# Patient Record
Sex: Female | Born: 1950
Health system: Southern US, Community
[De-identification: ages and names within clinical notes are randomized; demographics above are authoritative.]

## PROBLEM LIST (undated history)

## (undated) DIAGNOSIS — R112 Nausea with vomiting, unspecified: Secondary | ICD-10-CM

## (undated) DIAGNOSIS — R002 Palpitations: Secondary | ICD-10-CM

## (undated) DIAGNOSIS — K449 Diaphragmatic hernia without obstruction or gangrene: Secondary | ICD-10-CM

## (undated) DIAGNOSIS — I10 Essential (primary) hypertension: Secondary | ICD-10-CM

## (undated) DIAGNOSIS — I351 Nonrheumatic aortic (valve) insufficiency: Secondary | ICD-10-CM

## (undated) DIAGNOSIS — E785 Hyperlipidemia, unspecified: Secondary | ICD-10-CM

## (undated) DIAGNOSIS — Z9889 Other specified postprocedural states: Secondary | ICD-10-CM

## (undated) DIAGNOSIS — Z8489 Family history of other specified conditions: Secondary | ICD-10-CM

## (undated) HISTORY — PX: ABDOMINAL HYSTERECTOMY: SHX81

## (undated) HISTORY — PX: OTHER SURGICAL HISTORY: SHX169

## (undated) HISTORY — PX: KNEE SURGERY: SHX244

## (undated) HISTORY — PX: TONSILLECTOMY: SUR1361

## (undated) HISTORY — DX: Essential (primary) hypertension: I10

## (undated) HISTORY — DX: Diaphragmatic hernia without obstruction or gangrene: K44.9

## (undated) HISTORY — PX: CHOLECYSTECTOMY: SHX55

---

## 2007-05-17 ENCOUNTER — Ambulatory Visit (HOSPITAL_COMMUNITY): Admission: RE | Admit: 2007-05-17 | Discharge: 2007-05-17 | Payer: Self-pay | Admitting: Orthopaedic Surgery

## 2007-10-27 ENCOUNTER — Ambulatory Visit (HOSPITAL_COMMUNITY): Admission: RE | Admit: 2007-10-27 | Discharge: 2007-10-27 | Payer: Self-pay | Admitting: Urology

## 2011-05-12 NOTE — Op Note (Signed)
NAME:  Judy Mitchell, Judy Mitchell                ACCOUNT NO.:  [id number]   MEDICAL RECORD NO.:  [id number]          PATIENT TYPE:  AMB   LOCATION:  DAY                           FACILITY:  [facility]   PHYSICIAN:  J. Darreld Mclean, M.D. DATE OF BIRTH:  1951/05/10   DATE OF PROCEDURE:  05/17/2007  DATE OF DISCHARGE:                               OPERATIVE REPORT   PREOPERATIVE DIAGNOSIS:  Tear in medial and lateral meniscus of the  right knee.   POSTOPERATIVE DIAGNOSES:  1. Tear in medial meniscus of the right knee.  2. Medial femoral condyle defect, small loose fragments and particles.   PROCEDURE:  1. Arthroscopy of the right knee.  2. Partial medial meniscectomy.  3. Removal of loose particles.   ANESTHESIA:  General.   SURGEON:  Keeling.   SPECIMENS:  Portions of the medial meniscus and the loose body sent to  pathology.   TOURNIQUET TIME:  26 minutes.   No drains used.   INDICATIONS:  The patient is a 60 year old female with giving way and  pain in her knee on the left.  An MRI shows a tear of the medial  meniscus flap on the medial femoral condyle loose fragment and a tear in  the lateral side.  Surgery was recommended as she did not improve with  conservative treatment.  Risk and imponderables of the procedure were  discussed preoperatively, she appeared to understand and agreed to the  procedure as outlined.   DESCRIPTION OF PROCEDURE:  The patient was seen in the holding area.  The right knee was identified as the correct surgical site.  She placed  a marker on the right knee, I placed a marker on the right knee.  I  talked with family.  The patient was brought to the operating room and  given general anesthesia while supine on the operating room table.  Tourniquet placed deflated on right upper thigh.  A leg holder placed  right upper thigh.  She was prepped and draped in the usual manner.  We  had a time-out identifying Ms. Balbuena as the patient, right knee as  correct  surgical site.  The leg was prepared, the leg was elevated and  wrapped circumferentially with an Esmarch bandage.  Tourniquet inflated  to 250 mmHg, Esmarch bandage was removed.  Inflow cannula inserted  medially, lactated Ringer's instilled into the knee by an infusion pump.  Arthroscope inserted laterally and knee systematically evaluated.  Findings were the suprapatellar pouch had mild synovitis, surface of  patella had grade 2 changes particularly medially.  On the medial side  of the knee she had a tear in the posterior horn of medial meniscus,  stellate type tear, with a large articular defect of the medial femoral  condyle with some loose fragments floating.  Anterior cruciate was  intact laterally, the articular surface looked very good, grade 2  changes here, and small fragments within the knee.  There was no tear of  the meniscus, some slight fraying of the meniscus, could visualize it  very well.   Attention directed back to the medial  side of the knee and the loose  fragments were removed with the sucker shaver apparatus.  She has a  large articular defect of the medial femoral condyle and permanent  pictures were taken of this and throughout the entire case.  Meniscus  was removed using meniscal shaver, meniscal punch.  Then the holmium  laser was used and a good smooth contour was obtained.  Attention was  directed back to the lateral side to make sure there was no tears.  The  MRI says there was a small tear but I could not appreciate a tear  laterally.  The knee was again systematically examined and no loose  fragments identified.  The wound irrigated with remaining part of  lactated Ringer's and wound was reapproximated using #3-0 nylon in  interrupted vertical mattress manner.  Marcaine, 0.25%, instilled in  each portal.  Tourniquet deflated after 26 minutes.  A sterile dressing  applied, a bulky dressing applied, knee immobilizer applied.  She  already has Vicodin for  pain at home.  She will have physical therapy  arranged and I will see her in the office in approximately 10 days to 2  weeks; any difficulty, she is to contact me through the office hospital  beeper system, numbers provided.           ______________________________  Shela Commons. Darreld Mclean, M.D.     JWK/MEDQ  D:  05/17/2007  T:  05/17/2007  Job:  [id number]

## 2011-05-12 NOTE — H&P (Signed)
NAME:  Judy Mitchell, Judy Mitchell                ACCOUNT NO.:  [id number]   MEDICAL RECORD NO.:  [id number]          PATIENT TYPE:  AMB   LOCATION:  DAY                           FACILITY:  [facility]   PHYSICIAN:  J. Darreld Mclean, M.D. DATE OF BIRTH:  [birth year]   DATE OF ADMISSION:  05/17/2007  DATE OF DISCHARGE:  LH                              HISTORY & PHYSICAL   CHIEF COMPLAINT:  My knee hurts.   HISTORY OF PRESENT ILLNESS:  The patient is a 60 year old female who I  first saw in the office on May 05, 2007 with a history of pain and  tenderness in her knee for several weeks, with giving away, swelling,  and tenderness. She denies any trauma. She had no fever or chills. She  had no other joint pains. I was concerned about that time about a medial  meniscal tear. I recommended a MRI. She is extremely claustrophobic. She  went to get the MRI at [facility] but she was unable to get it at the  open unit. They suggested medication and I gave her some Valium and she  went back and had the MRI done at [facility] on May 12, 2007. MRI  shows horizontal under-surface tear and free edge radial tear of the  posterior horn of the medial meniscus with superimposed upon intra-  meniscal cyst. She had a full thickness cartilage defect of the medial  femoral condyle with a free cartilage fragment within the lateral knee  joint. She had a small under-surface tear of the posterior horn of the  lateral meniscus as well with ill-defined changes in the posterior horn  of the lateral meniscus. She had mild chondromalacia. She had some  changes in the MCL, consistent with recent strain and a knee effusion. I  went over the findings with her in the office on May 16, 2007. The  patient said that her knee was getting worse and she would like to have  surgery as soon as possible. I offered her to have surgery tomorrow,  which is the next day and she would like to go ahead and do this.   PAST MEDICAL HISTORY:   Negative.   ALLERGIES:  SULFA.   CURRENT MEDICATIONS:  Naprosyn 500 mg p.o. b.i.d. p.c. and Vicodin ES.   HABITS:  She does not smoke. She does not use alcoholic beverages.   PRIMARY CARE PHYSICIAN:  Ernestina Penna, M.D.   PAST SURGICAL HISTORY:  She had a hysterectomy in 1992, cholecystectomy  in 1994.   FAMILY HISTORY:  Parkinsonism runs in the family.   SIGNIFICANT PHYSICAL FINDINGS:  The patient is married and lives in  [place].   PHYSICAL EXAMINATION:  VITAL SIGNS:  Blood pressure 150/88, pulse 92,  respiratory rate 16, afebrile. Height 5 foot 5, weight 134.  GENERAL:  Alert, cooperative, and oriented.  HEENT:  Negative.  NECK:  Supple.  LUNGS:  Clear to auscultation and percussion.  HEART:  Regular without murmur heard.  ABDOMEN:  Soft, nontender, without masses.  EXTREMITIES:  Painful and tender on the right knee with mild effusion.  Positive Murray medially. Negative drawer sign. Other extremities within  normal limits.  NEUROLOGIC:  Intact.  SKIN:  Intact.   IMPRESSION:  Tear of medial meniscus left knee, possible tear of lateral  meniscus __________  knee, reticular defect with loose body within the  knee.   PLAN:  Arthroscopy of the right knee. I have discussed with the patient  the plan, procedure, risks, and imponderables. Her labs are pending.                                            ______________________________  J. Darreld Mclean, M.D.     JWK/MEDQ  D:  05/16/2007  T:  05/16/2007  Job:  [id number]

## 2011-12-30 ENCOUNTER — Other Ambulatory Visit: Payer: Self-pay | Admitting: Dermatology

## 2012-12-12 ENCOUNTER — Other Ambulatory Visit: Payer: Self-pay | Admitting: Dermatology

## 2013-01-17 ENCOUNTER — Other Ambulatory Visit: Payer: Self-pay | Admitting: Dermatology

## 2013-10-11 ENCOUNTER — Other Ambulatory Visit: Payer: Self-pay | Admitting: Dermatology

## 2014-11-07 ENCOUNTER — Other Ambulatory Visit: Payer: Self-pay | Admitting: Dermatology

## 2015-04-30 ENCOUNTER — Other Ambulatory Visit: Payer: Self-pay | Admitting: Dermatology

## 2015-04-30 DIAGNOSIS — C44319 Basal cell carcinoma of skin of other parts of face: Secondary | ICD-10-CM | POA: Diagnosis not present

## 2015-04-30 DIAGNOSIS — C4401 Basal cell carcinoma of skin of lip: Secondary | ICD-10-CM | POA: Diagnosis not present

## 2015-04-30 DIAGNOSIS — D485 Neoplasm of uncertain behavior of skin: Secondary | ICD-10-CM | POA: Diagnosis not present

## 2015-04-30 DIAGNOSIS — L821 Other seborrheic keratosis: Secondary | ICD-10-CM | POA: Diagnosis not present

## 2015-04-30 DIAGNOSIS — L570 Actinic keratosis: Secondary | ICD-10-CM | POA: Diagnosis not present

## 2015-04-30 DIAGNOSIS — Z85828 Personal history of other malignant neoplasm of skin: Secondary | ICD-10-CM | POA: Diagnosis not present

## 2015-05-14 DIAGNOSIS — E785 Hyperlipidemia, unspecified: Secondary | ICD-10-CM | POA: Diagnosis not present

## 2015-05-14 DIAGNOSIS — K219 Gastro-esophageal reflux disease without esophagitis: Secondary | ICD-10-CM | POA: Diagnosis not present

## 2015-05-14 DIAGNOSIS — I10 Essential (primary) hypertension: Secondary | ICD-10-CM | POA: Diagnosis not present

## 2015-05-14 DIAGNOSIS — Z780 Asymptomatic menopausal state: Secondary | ICD-10-CM | POA: Diagnosis not present

## 2015-05-16 DIAGNOSIS — Z85828 Personal history of other malignant neoplasm of skin: Secondary | ICD-10-CM | POA: Diagnosis not present

## 2015-05-16 DIAGNOSIS — C4401 Basal cell carcinoma of skin of lip: Secondary | ICD-10-CM | POA: Diagnosis not present

## 2015-08-22 DIAGNOSIS — R109 Unspecified abdominal pain: Secondary | ICD-10-CM | POA: Diagnosis not present

## 2015-09-17 DIAGNOSIS — Z79899 Other long term (current) drug therapy: Secondary | ICD-10-CM | POA: Diagnosis not present

## 2015-09-17 DIAGNOSIS — I10 Essential (primary) hypertension: Secondary | ICD-10-CM | POA: Diagnosis not present

## 2015-09-17 DIAGNOSIS — K5900 Constipation, unspecified: Secondary | ICD-10-CM | POA: Diagnosis not present

## 2015-09-17 DIAGNOSIS — Z8249 Family history of ischemic heart disease and other diseases of the circulatory system: Secondary | ICD-10-CM | POA: Diagnosis not present

## 2015-09-17 DIAGNOSIS — K5730 Diverticulosis of large intestine without perforation or abscess without bleeding: Secondary | ICD-10-CM | POA: Diagnosis not present

## 2015-09-17 DIAGNOSIS — Z808 Family history of malignant neoplasm of other organs or systems: Secondary | ICD-10-CM | POA: Diagnosis not present

## 2015-09-17 DIAGNOSIS — K589 Irritable bowel syndrome without diarrhea: Secondary | ICD-10-CM | POA: Diagnosis not present

## 2015-09-17 DIAGNOSIS — Z882 Allergy status to sulfonamides status: Secondary | ICD-10-CM | POA: Diagnosis not present

## 2015-09-17 DIAGNOSIS — F419 Anxiety disorder, unspecified: Secondary | ICD-10-CM | POA: Diagnosis not present

## 2015-09-17 DIAGNOSIS — R109 Unspecified abdominal pain: Secondary | ICD-10-CM | POA: Diagnosis not present

## 2015-09-17 DIAGNOSIS — K219 Gastro-esophageal reflux disease without esophagitis: Secondary | ICD-10-CM | POA: Diagnosis not present

## 2015-09-24 DIAGNOSIS — Z8719 Personal history of other diseases of the digestive system: Secondary | ICD-10-CM | POA: Diagnosis not present

## 2015-09-24 DIAGNOSIS — I10 Essential (primary) hypertension: Secondary | ICD-10-CM | POA: Diagnosis not present

## 2015-09-24 DIAGNOSIS — M25561 Pain in right knee: Secondary | ICD-10-CM | POA: Diagnosis not present

## 2015-09-25 DIAGNOSIS — I10 Essential (primary) hypertension: Secondary | ICD-10-CM | POA: Diagnosis not present

## 2015-09-25 DIAGNOSIS — Z23 Encounter for immunization: Secondary | ICD-10-CM | POA: Diagnosis not present

## 2015-10-09 DIAGNOSIS — Z6829 Body mass index (BMI) 29.0-29.9, adult: Secondary | ICD-10-CM | POA: Diagnosis not present

## 2015-10-09 DIAGNOSIS — Z8719 Personal history of other diseases of the digestive system: Secondary | ICD-10-CM | POA: Diagnosis not present

## 2015-10-09 DIAGNOSIS — M25561 Pain in right knee: Secondary | ICD-10-CM | POA: Diagnosis not present

## 2015-10-09 DIAGNOSIS — I10 Essential (primary) hypertension: Secondary | ICD-10-CM | POA: Diagnosis not present

## 2015-11-06 DIAGNOSIS — H2513 Age-related nuclear cataract, bilateral: Secondary | ICD-10-CM | POA: Diagnosis not present

## 2015-11-06 DIAGNOSIS — H538 Other visual disturbances: Secondary | ICD-10-CM | POA: Diagnosis not present

## 2015-11-13 DIAGNOSIS — L814 Other melanin hyperpigmentation: Secondary | ICD-10-CM | POA: Diagnosis not present

## 2015-11-13 DIAGNOSIS — Z85828 Personal history of other malignant neoplasm of skin: Secondary | ICD-10-CM | POA: Diagnosis not present

## 2015-11-13 DIAGNOSIS — D225 Melanocytic nevi of trunk: Secondary | ICD-10-CM | POA: Diagnosis not present

## 2015-11-13 DIAGNOSIS — C44519 Basal cell carcinoma of skin of other part of trunk: Secondary | ICD-10-CM | POA: Diagnosis not present

## 2015-11-13 DIAGNOSIS — L821 Other seborrheic keratosis: Secondary | ICD-10-CM | POA: Diagnosis not present

## 2015-11-13 DIAGNOSIS — D2239 Melanocytic nevi of other parts of face: Secondary | ICD-10-CM | POA: Diagnosis not present

## 2015-11-13 DIAGNOSIS — D485 Neoplasm of uncertain behavior of skin: Secondary | ICD-10-CM | POA: Diagnosis not present

## 2015-11-13 DIAGNOSIS — L570 Actinic keratosis: Secondary | ICD-10-CM | POA: Diagnosis not present

## 2015-12-10 DIAGNOSIS — C44519 Basal cell carcinoma of skin of other part of trunk: Secondary | ICD-10-CM | POA: Diagnosis not present

## 2015-12-10 DIAGNOSIS — Z85828 Personal history of other malignant neoplasm of skin: Secondary | ICD-10-CM | POA: Diagnosis not present

## 2015-12-16 DIAGNOSIS — J019 Acute sinusitis, unspecified: Secondary | ICD-10-CM | POA: Diagnosis not present

## 2015-12-19 DIAGNOSIS — R05 Cough: Secondary | ICD-10-CM | POA: Diagnosis not present

## 2015-12-19 DIAGNOSIS — J019 Acute sinusitis, unspecified: Secondary | ICD-10-CM | POA: Diagnosis not present

## 2016-02-21 DIAGNOSIS — Z1231 Encounter for screening mammogram for malignant neoplasm of breast: Secondary | ICD-10-CM | POA: Diagnosis not present

## 2016-04-23 DIAGNOSIS — R079 Chest pain, unspecified: Secondary | ICD-10-CM | POA: Diagnosis not present

## 2016-04-23 DIAGNOSIS — R51 Headache: Secondary | ICD-10-CM | POA: Diagnosis not present

## 2016-04-23 DIAGNOSIS — I10 Essential (primary) hypertension: Secondary | ICD-10-CM | POA: Diagnosis not present

## 2016-04-23 DIAGNOSIS — F419 Anxiety disorder, unspecified: Secondary | ICD-10-CM | POA: Diagnosis not present

## 2016-04-24 DIAGNOSIS — Z79899 Other long term (current) drug therapy: Secondary | ICD-10-CM | POA: Diagnosis not present

## 2016-04-24 DIAGNOSIS — I10 Essential (primary) hypertension: Secondary | ICD-10-CM | POA: Diagnosis not present

## 2016-04-24 DIAGNOSIS — R42 Dizziness and giddiness: Secondary | ICD-10-CM | POA: Diagnosis not present

## 2016-04-24 DIAGNOSIS — K219 Gastro-esophageal reflux disease without esophagitis: Secondary | ICD-10-CM | POA: Diagnosis not present

## 2016-04-24 DIAGNOSIS — R079 Chest pain, unspecified: Secondary | ICD-10-CM | POA: Diagnosis not present

## 2016-04-24 DIAGNOSIS — R200 Anesthesia of skin: Secondary | ICD-10-CM | POA: Diagnosis not present

## 2016-04-24 DIAGNOSIS — R202 Paresthesia of skin: Secondary | ICD-10-CM | POA: Diagnosis not present

## 2016-04-24 DIAGNOSIS — R61 Generalized hyperhidrosis: Secondary | ICD-10-CM | POA: Diagnosis not present

## 2016-04-24 DIAGNOSIS — F419 Anxiety disorder, unspecified: Secondary | ICD-10-CM | POA: Diagnosis not present

## 2016-04-24 DIAGNOSIS — R531 Weakness: Secondary | ICD-10-CM | POA: Diagnosis not present

## 2016-04-27 DIAGNOSIS — F419 Anxiety disorder, unspecified: Secondary | ICD-10-CM | POA: Diagnosis not present

## 2016-04-27 DIAGNOSIS — I10 Essential (primary) hypertension: Secondary | ICD-10-CM | POA: Diagnosis not present

## 2016-04-28 ENCOUNTER — Ambulatory Visit (INDEPENDENT_AMBULATORY_CARE_PROVIDER_SITE_OTHER): Payer: Medicare Other | Admitting: Orthopaedic Surgery

## 2016-04-28 ENCOUNTER — Ambulatory Visit: Payer: Self-pay | Admitting: Orthopaedic Surgery

## 2016-04-28 VITALS — BP 155/64 | HR 65 | Temp 97.2°F | Ht 66.5 in | Wt 170.4 lb

## 2016-04-28 DIAGNOSIS — M25561 Pain in right knee: Secondary | ICD-10-CM | POA: Diagnosis not present

## 2016-04-28 NOTE — Progress Notes (Signed)
Patient [id number] Simon Rhein, female DOB:1951/01/28, 65 y.o. [id number]  Chief Complaint  Patient presents with  . Follow-up    Right knee pain    HPI  LAYAAN QUIST is a 65 y.o. female who has had right knee pain.  It is almost resolved.  She has little to no pain now.  She has no swelling, no redness, no giving way, no trauma.  She is walking well.  HPI  Body mass index is 27.09 kg/(m^2).   Review of Systems  HENT: Negative for congestion.   Respiratory: Negative for cough and shortness of breath.   Cardiovascular: Negative for chest pain and leg swelling.  Endocrine: Negative for cold intolerance.  Musculoskeletal: Positive for arthralgias.  Allergic/Immunologic: Negative for environmental allergies.    No past medical history on file.  No past surgical history on file.  No family history on file.  Social History Social History  Substance Use Topics  . Smoking status: Not on file  . Smokeless tobacco: Not on file  . Alcohol Use: Not on file    Allergies  Allergen Reactions  . Sulfa Antibiotics     Current Outpatient Prescriptions  Medication Sig Dispense Refill  . losartan (COZAAR) 50 MG tablet Take 50 mg by mouth daily.  2   No current facility-administered medications for this visit.     Physical Exam  Blood pressure 155/64, pulse 65, temperature 97.2 F (36.2 C), height 5' 6.5" (1.689 m), weight 170 lb 6.4 oz (77.293 kg).  Constitutional: overall normal hygiene, normal nutrition, well developed, normal grooming, normal body habitus. Assistive device:none  Musculoskeletal: gait and station Limp none, muscle tone and strength are normal, no tremors or atrophy is present.  .  Neurological: coordination overall normal.  Deep tendon reflex/nerve stretch intact.  Sensation normal.  Cranial nerves II-XII intact.   Skin:   normal overall no scars, lesions, ulcers or rashes. No psoriasis.  Psychiatric: Alert and oriented x 3.  Recent memory intact, remote  memory unclear.  Normal mood and affect. Well groomed.  Good eye contact.  Cardiovascular: overall no swelling, no varicosities, no edema bilaterally, normal temperatures of the legs and arms, no clubbing, cyanosis and good capillary refill.  Lymphatic: palpation is normal.   Extremities:Both knees are normal Inspection both lower legs normal Strength and tone normal both lower legs Range of motion full both lower legs.  The patient has been educated about the nature of the problem(s) and counseled on treatment options.  The patient appeared to understand what I have discussed and is in agreement with it.  Encounter Diagnosis  Name Primary?  . Right knee pain Yes    PLAN Call if any problems.  Precautions discussed.  Continue current medications.   Return to clinic PRN.

## 2016-06-08 DIAGNOSIS — L570 Actinic keratosis: Secondary | ICD-10-CM | POA: Diagnosis not present

## 2016-06-08 DIAGNOSIS — L821 Other seborrheic keratosis: Secondary | ICD-10-CM | POA: Diagnosis not present

## 2016-06-08 DIAGNOSIS — Z85828 Personal history of other malignant neoplasm of skin: Secondary | ICD-10-CM | POA: Diagnosis not present

## 2016-06-08 DIAGNOSIS — L820 Inflamed seborrheic keratosis: Secondary | ICD-10-CM | POA: Diagnosis not present

## 2016-08-25 DIAGNOSIS — H2513 Age-related nuclear cataract, bilateral: Secondary | ICD-10-CM | POA: Diagnosis not present

## 2016-08-25 DIAGNOSIS — H2511 Age-related nuclear cataract, right eye: Secondary | ICD-10-CM | POA: Diagnosis not present

## 2016-08-25 DIAGNOSIS — H524 Presbyopia: Secondary | ICD-10-CM | POA: Diagnosis not present

## 2016-09-16 DIAGNOSIS — H2511 Age-related nuclear cataract, right eye: Secondary | ICD-10-CM | POA: Diagnosis not present

## 2016-09-16 DIAGNOSIS — H2512 Age-related nuclear cataract, left eye: Secondary | ICD-10-CM | POA: Diagnosis not present

## 2016-09-29 DIAGNOSIS — Z23 Encounter for immunization: Secondary | ICD-10-CM | POA: Diagnosis not present

## 2016-10-28 DIAGNOSIS — H2512 Age-related nuclear cataract, left eye: Secondary | ICD-10-CM | POA: Diagnosis not present

## 2016-10-28 HISTORY — PX: OTHER SURGICAL HISTORY: SHX169

## 2016-11-09 DIAGNOSIS — Z6828 Body mass index (BMI) 28.0-28.9, adult: Secondary | ICD-10-CM | POA: Diagnosis not present

## 2016-11-09 DIAGNOSIS — J0100 Acute maxillary sinusitis, unspecified: Secondary | ICD-10-CM | POA: Diagnosis not present

## 2016-12-23 DIAGNOSIS — D1801 Hemangioma of skin and subcutaneous tissue: Secondary | ICD-10-CM | POA: Diagnosis not present

## 2016-12-23 DIAGNOSIS — D485 Neoplasm of uncertain behavior of skin: Secondary | ICD-10-CM | POA: Diagnosis not present

## 2016-12-23 DIAGNOSIS — L720 Epidermal cyst: Secondary | ICD-10-CM | POA: Diagnosis not present

## 2016-12-23 DIAGNOSIS — C44319 Basal cell carcinoma of skin of other parts of face: Secondary | ICD-10-CM | POA: Diagnosis not present

## 2016-12-23 DIAGNOSIS — C44311 Basal cell carcinoma of skin of nose: Secondary | ICD-10-CM | POA: Diagnosis not present

## 2016-12-23 DIAGNOSIS — L570 Actinic keratosis: Secondary | ICD-10-CM | POA: Diagnosis not present

## 2016-12-23 DIAGNOSIS — Z85828 Personal history of other malignant neoplasm of skin: Secondary | ICD-10-CM | POA: Diagnosis not present

## 2017-01-20 DIAGNOSIS — C44311 Basal cell carcinoma of skin of nose: Secondary | ICD-10-CM | POA: Diagnosis not present

## 2017-01-20 DIAGNOSIS — Z85828 Personal history of other malignant neoplasm of skin: Secondary | ICD-10-CM | POA: Diagnosis not present

## 2017-01-20 DIAGNOSIS — C4401 Basal cell carcinoma of skin of lip: Secondary | ICD-10-CM | POA: Diagnosis not present

## 2017-02-04 DIAGNOSIS — F419 Anxiety disorder, unspecified: Secondary | ICD-10-CM | POA: Diagnosis not present

## 2017-02-04 DIAGNOSIS — Z6828 Body mass index (BMI) 28.0-28.9, adult: Secondary | ICD-10-CM | POA: Diagnosis not present

## 2017-02-04 DIAGNOSIS — I10 Essential (primary) hypertension: Secondary | ICD-10-CM | POA: Diagnosis not present

## 2017-02-11 ENCOUNTER — Encounter (INDEPENDENT_AMBULATORY_CARE_PROVIDER_SITE_OTHER): Payer: Self-pay

## 2017-02-11 ENCOUNTER — Encounter (INDEPENDENT_AMBULATORY_CARE_PROVIDER_SITE_OTHER): Payer: Self-pay | Admitting: Internal Medicine

## 2017-02-23 ENCOUNTER — Encounter (INDEPENDENT_AMBULATORY_CARE_PROVIDER_SITE_OTHER): Payer: Self-pay | Admitting: Internal Medicine

## 2017-02-23 ENCOUNTER — Ambulatory Visit (INDEPENDENT_AMBULATORY_CARE_PROVIDER_SITE_OTHER): Payer: Medicare Other | Admitting: Internal Medicine

## 2017-02-23 ENCOUNTER — Encounter (INDEPENDENT_AMBULATORY_CARE_PROVIDER_SITE_OTHER): Payer: Self-pay | Admitting: *Deleted

## 2017-02-23 VITALS — BP 170/94 | HR 72 | Temp 97.9°F | Ht 66.0 in | Wt 176.7 lb

## 2017-02-23 DIAGNOSIS — R1310 Dysphagia, unspecified: Secondary | ICD-10-CM | POA: Diagnosis not present

## 2017-02-23 DIAGNOSIS — R1319 Other dysphagia: Secondary | ICD-10-CM

## 2017-02-23 DIAGNOSIS — K219 Gastro-esophageal reflux disease without esophagitis: Secondary | ICD-10-CM | POA: Diagnosis not present

## 2017-02-23 DIAGNOSIS — I10 Essential (primary) hypertension: Secondary | ICD-10-CM | POA: Insufficient documentation

## 2017-02-23 HISTORY — DX: Essential (primary) hypertension: I10

## 2017-02-23 MED ORDER — OMEPRAZOLE 40 MG PO CPDR: 40.0000 mg | DELAYED_RELEASE_CAPSULE | Freq: Every day | ORAL | 3 refills | Status: DC

## 2017-02-23 NOTE — Progress Notes (Signed)
   Subjective:    Patient ID: Judy Mitchell, female    DOB: [birth year], 66 y.o.   MRN: [id number]  HPI Referred by K. Skillman PA for epigastric pain. About 16 months ago she had a screening colonoscopy by Dr. Britta Mccreedy and she reports it was normal. She told him that she was had fullness in her epigastric region. She was advised to take Fibercon.  When she eats, she feels like her foods stops in her lower esophagus. She feels bloated.  There is no pain. She says she has hunger pains under both ribs.  Crackers and bread causes her to bloat. She says she has had weight gain.  Takes Pepcid as needed. Her appetite is good. She has a BM daily.     Review of Systems Past Medical History:  Diagnosis Date  . High blood pressure 02/23/2017    No past surgical history on file.  Allergies  Allergen Reactions  . Sulfa Antibiotics     Itch,rash    Current Outpatient Prescriptions on File Prior to Visit  Medication Sig Dispense Refill  . losartan (COZAAR) 50 MG tablet Take 50 mg by mouth daily.  2   No current facility-administered medications on file prior to visit.        Objective:   Physical Exam Blood pressure (!) 170/94, pulse 72, temperature 97.9 F (36.6 C), height 5\' 6"  (1.676 m), weight 176 lb 11.2 oz (80.2 kg). Alert and oriented. Skin warm and dry. Oral mucosa is moist.   . Sclera anicteric, conjunctivae is pink. Thyroid not enlarged. No cervical lymphadenopathy. Lungs clear. Heart regular rate and rhythm.  Abdomen is soft. Bowel sounds are positive. No hepatomegaly. No abdominal masses felt. No tenderness.  No edema to lower extremities.   .       Assessment & Plan:  Dysphagia. Esohageal stricture/web needs to be ruled out.  DG Esophagram.  Rx Omeprazole.

## 2017-02-23 NOTE — Patient Instructions (Signed)
Rx for Omeprazole. DG Esophagram.

## 2017-02-26 ENCOUNTER — Encounter (HOSPITAL_COMMUNITY): Payer: Self-pay | Admitting: Radiology

## 2017-02-26 ENCOUNTER — Ambulatory Visit (HOSPITAL_COMMUNITY)
Admission: RE | Admit: 2017-02-26 | Discharge: 2017-02-26 | Disposition: A | Discharge status: Home / Self Care | Payer: Medicare Other | Source: Ambulatory Visit | Attending: Internal Medicine | Admitting: Internal Medicine

## 2017-02-26 DIAGNOSIS — K224 Dyskinesia of esophagus: Secondary | ICD-10-CM | POA: Diagnosis not present

## 2017-02-26 DIAGNOSIS — K449 Diaphragmatic hernia without obstruction or gangrene: Secondary | ICD-10-CM | POA: Diagnosis not present

## 2017-02-26 DIAGNOSIS — R1310 Dysphagia, unspecified: Secondary | ICD-10-CM | POA: Insufficient documentation

## 2017-02-26 DIAGNOSIS — R1319 Other dysphagia: Secondary | ICD-10-CM

## 2017-03-02 ENCOUNTER — Encounter (INDEPENDENT_AMBULATORY_CARE_PROVIDER_SITE_OTHER): Payer: Self-pay | Admitting: Internal Medicine

## 2017-03-02 DIAGNOSIS — Z961 Presence of intraocular lens: Secondary | ICD-10-CM | POA: Diagnosis not present

## 2017-03-02 NOTE — Progress Notes (Signed)
Patient was given an appointment for 04/13/17 at 11:30am with Deberah Castle, NP.  A letter was mailed to the patient.

## 2017-03-09 ENCOUNTER — Encounter: Payer: Self-pay | Admitting: Orthopaedic Surgery

## 2017-03-09 ENCOUNTER — Ambulatory Visit (INDEPENDENT_AMBULATORY_CARE_PROVIDER_SITE_OTHER): Payer: Medicare Other | Admitting: Orthopaedic Surgery

## 2017-03-09 ENCOUNTER — Ambulatory Visit (INDEPENDENT_AMBULATORY_CARE_PROVIDER_SITE_OTHER): Payer: Medicare Other

## 2017-03-09 VITALS — BP 181/86 | HR 84 | Temp 97.7°F | Ht 66.0 in | Wt 177.0 lb

## 2017-03-09 DIAGNOSIS — M25572 Pain in left ankle and joints of left foot: Secondary | ICD-10-CM

## 2017-03-09 DIAGNOSIS — S92342A Displaced fracture of fourth metatarsal bone, left foot, initial encounter for closed fracture: Secondary | ICD-10-CM | POA: Diagnosis not present

## 2017-03-09 NOTE — Progress Notes (Signed)
Patient [id number] Judy Mitchell, female DOB:03-09-1951, 66 y.o. Judy Mitchell  Chief Complaint  Patient presents with  . Foot Pain    left foot pain since fall 03/08/17    HPI  Judy Mitchell is a 66 y.o. female who fell yesterday and hurt her back and her left foot.  She lost the "wind" when she fell, but recovered quickly.  She had some back pain last evening but it has resolved.  Her left foot has swollen and is painful with any weight bearing. She has no redness and has some slight ecchymosis of the left foot.  She has no other injury. HPI  Body mass index is 28.57 kg/m.  ROS  Review of Systems  HENT: Negative for congestion.   Respiratory: Negative for cough and shortness of breath.   Cardiovascular: Negative for chest pain and leg swelling.  Endocrine: Negative for cold intolerance.  Musculoskeletal: Positive for arthralgias.  Allergic/Immunologic: Negative for environmental allergies.    Past Medical History:  Diagnosis Date  . High blood pressure 02/23/2017    Past Surgical History:  Procedure Laterality Date  . CHOLECYSTECTOMY    . complete hysterectomy    . endometriosis surgery      No family history on file.  Social History Social History  Substance Use Topics  . Smoking status: Never Smoker  . Smokeless tobacco: Never Used  . Alcohol use Yes     Comment: rarely    Allergies  Allergen Reactions  . Sulfa Antibiotics     Itch,rash    Current Outpatient Prescriptions  Medication Sig Dispense Refill  . ALPRAZolam (XANAX) 0.25 MG tablet Take 0.25 mg by mouth at bedtime as needed for anxiety.    Marland Kitchen losartan (COZAAR) 50 MG tablet Take 50 mg by mouth daily.  2  . omeprazole (PRILOSEC) 40 MG capsule Take 1 capsule (40 mg total) by mouth daily. 90 capsule 3  . famotidine (PEPCID) 20 MG tablet Take 20 mg by mouth 2 (two) times daily.     No current facility-administered medications for this visit.      Physical Exam  Blood pressure (!) 181/86, pulse 84,  temperature 97.7 F (36.5 C), height 5\' 6"  (1.676 m), weight 177 lb (80.3 kg).  Constitutional: overall normal hygiene, normal nutrition, well developed, normal grooming, normal body habitus. Assistive device:none  Musculoskeletal: gait and station Limp left, muscle tone and strength are normal, no tremors or atrophy is present.  .  Neurological: coordination overall normal.  Deep tendon reflex/nerve stretch intact.  Sensation normal.  Cranial nerves II-XII intact.   Skin:   Normal overall no scars, lesions, ulcers or rashes. No psoriasis.  Psychiatric: Alert and oriented x 3.  Recent memory intact, remote memory unclear.  Normal mood and affect. Well groomed.  Good eye contact.  Cardiovascular: overall no swelling, no varicosities, no edema bilaterally, normal temperatures of the legs and arms, no clubbing, cyanosis and good capillary refill.  Lymphatic: palpation is normal.  Spine/Pelvis examination:  Inspection:  Overall, sacoiliac joint benign and hips nontender; without crepitus or defects.   Thoracic spine inspection: Alignment normal without kyphosis present   Lumbar spine inspection:  Alignment  with normal lumbar lordosis, without scoliosis apparent.   Thoracic spine palpation:  without tenderness of spinal processes   Lumbar spine palpation: without tenderness of lumbar area; without tightness of lumbar muscles    Range of Motion:   Lumbar flexion, forward flexion is 45 without pain or tenderness    Lumbar extension  is full without pain or tenderness   Left lateral bend is Normal  without pain or tenderness   Right lateral bend is Normal without pain or tenderness   Straight leg raising is Normal   Strength & tone: Normal   Stability overall normal stability   The left foot has swelling and slight ecchymosis.  She has more pain over the fourth and fifth metatarsals.  NV is intact.  She has pain with weight bearing.  X-rays of the foot were done on the left,  reported separately.  The patient has been educated about the nature of the problem(s) and counseled on treatment options.  The patient appeared to understand what I have discussed and is in agreement with it.  Encounter Diagnoses  Name Primary?  . Pain of joint of left ankle and foot Yes  . Closed displaced fracture of fourth metatarsal bone of left foot, initial encounter    Instructions for Contrast Baths given.  CAM walker given.  PLAN Call if any problems.  Precautions discussed.  Continue current medications.   Return to clinic 1 week   X-rays on return.  Do the contrast baths.  Elevate. Get walker or crutches.  Tylenol for pain.  Electronically Signed Sanjuana Kava, MD 3/13/20182:05 PM

## 2017-03-09 NOTE — Patient Instructions (Addendum)
Do contrast baths.  Elevate foot.  Use crutches or walker.

## 2017-03-16 ENCOUNTER — Encounter: Payer: Self-pay | Admitting: Orthopaedic Surgery

## 2017-03-16 ENCOUNTER — Ambulatory Visit (INDEPENDENT_AMBULATORY_CARE_PROVIDER_SITE_OTHER): Payer: Self-pay | Admitting: Orthopaedic Surgery

## 2017-03-16 ENCOUNTER — Ambulatory Visit (INDEPENDENT_AMBULATORY_CARE_PROVIDER_SITE_OTHER): Payer: Medicare Other

## 2017-03-16 VITALS — BP 146/78 | HR 84 | Temp 97.7°F | Ht 66.0 in | Wt 178.0 lb

## 2017-03-16 DIAGNOSIS — S92342D Displaced fracture of fourth metatarsal bone, left foot, subsequent encounter for fracture with routine healing: Secondary | ICD-10-CM

## 2017-03-16 NOTE — Progress Notes (Signed)
CC:  I have less swelling of my left foot  She has less pain and swelling of the left foot.  She is using the CAM walker and a crutch.  NV is intact.  She has resolving ecchymosis of her left foot.  X-rays were done, reported separately.  Encounter Diagnosis  Name Primary?  . Closed displaced fracture of fourth metatarsal bone of left foot with routine healing, subsequent encounter Yes    Return in three weeks.  X-rays then.  Call if any problem.  Electronically Signed Sanjuana Kava, MD 3/20/20184:05 PM

## 2017-04-05 DIAGNOSIS — F419 Anxiety disorder, unspecified: Secondary | ICD-10-CM | POA: Diagnosis not present

## 2017-04-05 DIAGNOSIS — I10 Essential (primary) hypertension: Secondary | ICD-10-CM | POA: Diagnosis not present

## 2017-04-05 DIAGNOSIS — Z6828 Body mass index (BMI) 28.0-28.9, adult: Secondary | ICD-10-CM | POA: Diagnosis not present

## 2017-04-06 ENCOUNTER — Ambulatory Visit (INDEPENDENT_AMBULATORY_CARE_PROVIDER_SITE_OTHER): Payer: Self-pay | Admitting: Orthopaedic Surgery

## 2017-04-06 ENCOUNTER — Encounter: Payer: Self-pay | Admitting: Orthopaedic Surgery

## 2017-04-06 ENCOUNTER — Ambulatory Visit (INDEPENDENT_AMBULATORY_CARE_PROVIDER_SITE_OTHER): Payer: Medicare Other

## 2017-04-06 DIAGNOSIS — S92342D Displaced fracture of fourth metatarsal bone, left foot, subsequent encounter for fracture with routine healing: Secondary | ICD-10-CM

## 2017-04-06 NOTE — Progress Notes (Signed)
CC:  My foot is much less painful  She is walking better, still with the CAM walker.  She uses a slipper at times at home.  She has much less pain and swelling of the left foot.  NV is intact.  X-rays were done, reported separately.  Encounter Diagnosis  Name Primary?  . Closed displaced fracture of fourth metatarsal bone of left foot with routine healing, subsequent encounter Yes   She may gradually come out of CAM walker, use it when out of the house.  Return in one month.  X-rays left foot on return.  Call if any problem.  Electronically Signed Sanjuana Kava, MD 4/10/20189:06 AM

## 2017-04-13 ENCOUNTER — Ambulatory Visit (INDEPENDENT_AMBULATORY_CARE_PROVIDER_SITE_OTHER): Payer: Medicare Other | Admitting: Internal Medicine

## 2017-04-13 ENCOUNTER — Encounter (INDEPENDENT_AMBULATORY_CARE_PROVIDER_SITE_OTHER): Payer: Self-pay | Admitting: *Deleted

## 2017-04-13 ENCOUNTER — Other Ambulatory Visit (INDEPENDENT_AMBULATORY_CARE_PROVIDER_SITE_OTHER): Payer: Self-pay | Admitting: Internal Medicine

## 2017-04-13 ENCOUNTER — Encounter (INDEPENDENT_AMBULATORY_CARE_PROVIDER_SITE_OTHER): Payer: Self-pay | Admitting: Internal Medicine

## 2017-04-13 VITALS — BP 150/86 | HR 64 | Temp 97.6°F | Ht 66.0 in | Wt 173.3 lb

## 2017-04-13 DIAGNOSIS — R140 Abdominal distension (gaseous): Secondary | ICD-10-CM

## 2017-04-13 DIAGNOSIS — K449 Diaphragmatic hernia without obstruction or gangrene: Secondary | ICD-10-CM | POA: Diagnosis not present

## 2017-04-13 HISTORY — DX: Diaphragmatic hernia without obstruction or gangrene: K44.9

## 2017-04-13 NOTE — Progress Notes (Signed)
   Subjective:    Patient ID: Judy Mitchell, female    DOB: [birth year], 66 y.o.   MRN: [id number]  HPI  Here today for f/u. She was last seen in February for dysphagia. She tells me she is not doing good. She stopped the Omeprazole. She is almost constantly feel pressure.  When she eats, she has pressure in her epigastric region. She feels like sometimes she is having a heart attack.  She has bloating.  DG esophagram reveals large hiatal hernia.  She has early satiety.  03/24/2017 DG esophagram. IMPRESSION: Large hiatal hernia.  Age-related esophageal dysmotility without evidence of stricture or obstruction.  Laryngeal penetration without aspiration.    Review of Systems Past Medical History:  Diagnosis Date  . Hiatal hernia 04/13/2017  . High blood pressure 02/23/2017    Past Surgical History:  Procedure Laterality Date  . CHOLECYSTECTOMY    . complete hysterectomy    . endometriosis surgery      Allergies  Allergen Reactions  . Sulfa Antibiotics     Itch,rash    Current Outpatient Prescriptions on File Prior to Visit  Medication Sig Dispense Refill  . ALPRAZolam (XANAX) 0.25 MG tablet Take 0.25 mg by mouth at bedtime as needed for anxiety.    Marland Kitchen losartan (COZAAR) 50 MG tablet Take 50 mg by mouth daily.  2  . omeprazole (PRILOSEC) 40 MG capsule Take 1 capsule (40 mg total) by mouth daily. 90 capsule 3   No current facility-administered medications on file prior to visit.        Objective:   Physical Exam Blood pressure (!) 150/86, pulse 64, temperature 97.6 F (36.4 C), height 5\' 6"  (1.676 m), weight 173 lb 4.8 oz (78.6 kg).  Alert and oriented. Skin warm and dry. Oral mucosa is moist.   . Sclera anicteric, conjunctivae is pink. Thyroid not enlarged. No cervical lymphadenopathy. Lungs clear. Heart regular rate and rhythm.  Abdomen is soft. Bowel sounds are positive. No hepatomegaly. No abdominal masses felt. No tenderness.  No edema to lower extremities. V        Assessment & Plan:  Large Hiatal hernia. Bloating.  EGD.

## 2017-04-13 NOTE — Patient Instructions (Signed)
EGD. The risks and benefits such as perforation, bleeding, and infection were reviewed with the patient and is agreeable. 

## 2017-04-21 DIAGNOSIS — Z85828 Personal history of other malignant neoplasm of skin: Secondary | ICD-10-CM | POA: Diagnosis not present

## 2017-04-21 DIAGNOSIS — C44519 Basal cell carcinoma of skin of other part of trunk: Secondary | ICD-10-CM | POA: Diagnosis not present

## 2017-04-21 DIAGNOSIS — L570 Actinic keratosis: Secondary | ICD-10-CM | POA: Diagnosis not present

## 2017-04-21 DIAGNOSIS — C44319 Basal cell carcinoma of skin of other parts of face: Secondary | ICD-10-CM | POA: Diagnosis not present

## 2017-04-21 DIAGNOSIS — D485 Neoplasm of uncertain behavior of skin: Secondary | ICD-10-CM | POA: Diagnosis not present

## 2017-05-04 ENCOUNTER — Other Ambulatory Visit: Payer: Self-pay | Admitting: Radiology

## 2017-05-04 ENCOUNTER — Ambulatory Visit: Payer: Medicare Other | Admitting: Orthopaedic Surgery

## 2017-05-04 DIAGNOSIS — S92342D Displaced fracture of fourth metatarsal bone, left foot, subsequent encounter for fracture with routine healing: Secondary | ICD-10-CM

## 2017-05-05 ENCOUNTER — Ambulatory Visit (INDEPENDENT_AMBULATORY_CARE_PROVIDER_SITE_OTHER): Payer: Self-pay | Admitting: Orthopaedic Surgery

## 2017-05-05 ENCOUNTER — Ambulatory Visit (INDEPENDENT_AMBULATORY_CARE_PROVIDER_SITE_OTHER): Payer: Medicare Other

## 2017-05-05 ENCOUNTER — Encounter: Payer: Self-pay | Admitting: Orthopaedic Surgery

## 2017-05-05 VITALS — BP 148/69 | HR 70 | Ht 66.0 in | Wt 178.0 lb

## 2017-05-05 DIAGNOSIS — S92342D Displaced fracture of fourth metatarsal bone, left foot, subsequent encounter for fracture with routine healing: Secondary | ICD-10-CM

## 2017-05-05 NOTE — Progress Notes (Signed)
CC:  My foot is still a little sore  She is in the CAM walker still. She has some tenderness walking without it.  She has walked in her home without it but she says the foot is still tender for a regular shoe.  NV intact.  Swelling has decreased.  X-rays were done, reported separately.  Encounter Diagnosis  Name Primary?  . Closed displaced fracture of fourth metatarsal bone of left foot with routine healing, subsequent encounter Yes   Return in one month.  X-rays on return.  Call if any problem.  Electronically Signed Sanjuana Kava, MD 5/9/20188:32 AM

## 2017-05-26 ENCOUNTER — Encounter (HOSPITAL_COMMUNITY): Payer: Self-pay | Admitting: *Deleted

## 2017-05-26 ENCOUNTER — Encounter (HOSPITAL_COMMUNITY)
Admission: RE | Disposition: A | Discharge status: Home / Self Care | Payer: Self-pay | Source: Ambulatory Visit | Attending: Internal Medicine

## 2017-05-26 ENCOUNTER — Ambulatory Visit (HOSPITAL_COMMUNITY)
Admission: RE | Admit: 2017-05-26 | Discharge: 2017-05-26 | Disposition: A | Discharge status: Home / Self Care | Payer: Medicare Other | Source: Ambulatory Visit | Attending: Internal Medicine | Admitting: Internal Medicine

## 2017-05-26 DIAGNOSIS — Z90710 Acquired absence of both cervix and uterus: Secondary | ICD-10-CM | POA: Insufficient documentation

## 2017-05-26 DIAGNOSIS — K219 Gastro-esophageal reflux disease without esophagitis: Secondary | ICD-10-CM | POA: Insufficient documentation

## 2017-05-26 DIAGNOSIS — Z7982 Long term (current) use of aspirin: Secondary | ICD-10-CM | POA: Diagnosis not present

## 2017-05-26 DIAGNOSIS — Z79899 Other long term (current) drug therapy: Secondary | ICD-10-CM | POA: Insufficient documentation

## 2017-05-26 DIAGNOSIS — Z882 Allergy status to sulfonamides status: Secondary | ICD-10-CM | POA: Diagnosis not present

## 2017-05-26 DIAGNOSIS — K228 Other specified diseases of esophagus: Secondary | ICD-10-CM | POA: Insufficient documentation

## 2017-05-26 DIAGNOSIS — Z8249 Family history of ischemic heart disease and other diseases of the circulatory system: Secondary | ICD-10-CM | POA: Diagnosis not present

## 2017-05-26 DIAGNOSIS — Z820 Family history of epilepsy and other diseases of the nervous system: Secondary | ICD-10-CM | POA: Insufficient documentation

## 2017-05-26 DIAGNOSIS — R1310 Dysphagia, unspecified: Secondary | ICD-10-CM | POA: Diagnosis not present

## 2017-05-26 DIAGNOSIS — K449 Diaphragmatic hernia without obstruction or gangrene: Secondary | ICD-10-CM | POA: Diagnosis not present

## 2017-05-26 DIAGNOSIS — I10 Essential (primary) hypertension: Secondary | ICD-10-CM | POA: Insufficient documentation

## 2017-05-26 DIAGNOSIS — R140 Abdominal distension (gaseous): Secondary | ICD-10-CM | POA: Diagnosis not present

## 2017-05-26 DIAGNOSIS — R1314 Dysphagia, pharyngoesophageal phase: Secondary | ICD-10-CM | POA: Insufficient documentation

## 2017-05-26 HISTORY — DX: Nausea with vomiting, unspecified: R11.2

## 2017-05-26 HISTORY — PX: ESOPHAGOGASTRODUODENOSCOPY: SHX5428

## 2017-05-26 HISTORY — DX: Other specified postprocedural states: Z98.890

## 2017-05-26 SURGERY — EGD (ESOPHAGOGASTRODUODENOSCOPY)
Anesthesia: Moderate Sedation

## 2017-05-26 MED ORDER — STERILE WATER FOR IRRIGATION IR SOLN
Status: DC | PRN
  Administered 2017-05-26: 2.5 mL

## 2017-05-26 MED ORDER — MEPERIDINE HCL 50 MG/ML IJ SOLN
INTRAMUSCULAR | Status: DC
Start: 2017-05-26 — End: 2017-05-26
  Filled 2017-05-26: qty 1

## 2017-05-26 MED ORDER — MEPERIDINE HCL 50 MG/ML IJ SOLN
INTRAMUSCULAR | Status: DC | PRN
  Administered 2017-05-26: 25 mg via INTRAVENOUS
  Administered 2017-05-26: 25 mg

## 2017-05-26 MED ORDER — SODIUM CHLORIDE 0.9 % IV SOLN
INTRAVENOUS | Status: DC
  Administered 2017-05-26: 1000 mL via INTRAVENOUS

## 2017-05-26 MED ORDER — MIDAZOLAM HCL 5 MG/5ML IJ SOLN
INTRAMUSCULAR | Status: DC | PRN
  Administered 2017-05-26: 1 mg via INTRAVENOUS
  Administered 2017-05-26 (×3): 2 mg via INTRAVENOUS
  Administered 2017-05-26: 1 mg via INTRAVENOUS

## 2017-05-26 MED ORDER — LIDOCAINE VISCOUS 2 % MT SOLN
OROMUCOSAL | Status: DC | PRN
  Administered 2017-05-26: 1 via OROMUCOSAL

## 2017-05-26 MED ORDER — MIDAZOLAM HCL 5 MG/5ML IJ SOLN
INTRAMUSCULAR | Status: AC
  Filled 2017-05-26: qty 10

## 2017-05-26 MED ORDER — LIDOCAINE VISCOUS 2 % MT SOLN
OROMUCOSAL | Status: AC
  Filled 2017-05-26: qty 15

## 2017-05-26 NOTE — H&P (Signed)
Judy Mitchell is an 67 y.o. female.   Chief Complaint: Patient is here for EGD and possible EGD. HPI: Patient is 66 year old Caucasian female was chronic GERD and now presents with intermittent heartburn solid food dysphagia. She points or sternal areas site of bolus obstruction. She also complains of postprandial bloating and pain along left side of her chest and left shoulder which is relieved with belching. She denies nausea vomiting hematemesis melena abdominal pain. She had upper GI study 3 months ago revealing large hiatal hernia.   Past Medical History:  Diagnosis Date  . Hiatal hernia 04/13/2017  . High blood pressure 02/23/2017  . PONV (postoperative nausea and vomiting)     Past Surgical History:  Procedure Laterality Date  . ABDOMINAL HYSTERECTOMY    . CHOLECYSTECTOMY    . complete hysterectomy    . endometriosis surgery    . KNEE SURGERY Right   . TONSILLECTOMY      Family History  Problem Relation Age of Onset  . Parkinson's disease Father   . Atrial fibrillation Father   . Ulcers Sister   . Heart attack Sister   . Parkinson's disease Maternal Grandmother   . Parkinson's disease Paternal Grandmother    Social History:  reports that she has never smoked. She has never used smokeless tobacco. She reports that she drinks alcohol. She reports that she does not use drugs.  Allergies:  Allergies  Allergen Reactions  . Sulfa Antibiotics Itching and Rash    Medications Prior to Admission  Medication Sig Dispense Refill  . ALPRAZolam (XANAX) 0.25 MG tablet Take 0.125 mg by mouth 2 (two) times daily as needed for anxiety.     Marland Kitchen aspirin EC 81 MG tablet Take 81 mg by mouth daily as needed for mild pain.    . famotidine (PEPCID) 20 MG tablet Take 20 mg by mouth 2 (two) times a week.    . losartan (COZAAR) 50 MG tablet Take 50 mg by mouth daily.  2  . omeprazole (PRILOSEC) 40 MG capsule Take 1 capsule (40 mg total) by mouth daily. 90 capsule 3    No results found for  this or any previous visit (from the past 48 hour(s)). No results found.  ROS  Blood pressure (!) 153/71, pulse 71, temperature 97.8 F (36.6 C), temperature source Oral, resp. rate 18, height 5\' 6"  (1.676 m), weight 175 lb (79.4 kg), SpO2 95 %. Physical Exam  Constitutional: She appears well-developed and well-nourished.  HENT:  Mouth/Throat: Oropharynx is clear and moist.  Eyes: Conjunctivae are normal. No scleral icterus.  Neck: No thyromegaly present.  Cardiovascular: Normal rate, regular rhythm and normal heart sounds.   No murmur heard. Respiratory: Effort normal and breath sounds normal.  GI: Soft. She exhibits no distension and no mass. There is no tenderness.  Musculoskeletal: She exhibits no edema.  Lymphadenopathy:    She has no cervical adenopathy.  Neurological: She is alert.  Skin: Skin is warm and dry.     Assessment/Plan Solid food dysphagia. Chronic GERD. EGD possible ED.  Hildred Laser, MD 05/26/2017, 1:49 PM

## 2017-05-26 NOTE — Discharge Instructions (Signed)
Resume usual medications and diet. No driving for 24 hours. Keep symptom diary until office visit in 3 months.   Upper Endoscopy, Care After Refer to this sheet in the next few weeks. These instructions provide you with information about caring for yourself after your procedure. Your health care provider may also give you more specific instructions. Your treatment has been planned according to current medical practices, but problems sometimes occur. Call your health care provider if you have any problems or questions after your procedure. What can I expect after the procedure? After the procedure, it is common to have:  A sore throat.  Bloating.  Nausea. Follow these instructions at home:  Follow instructions from your health care provider about what to eat or drink after your procedure.  Return to your normal activities as told by your health care provider. Ask your health care provider what activities are safe for you.  Take over-the-counter and prescription medicines only as told by your health care provider.  Do not drive for 24 hours if you received a sedative.  Keep all follow-up visits as told by your health care provider. This is important. Contact a health care provider if:  You have a sore throat that lasts longer than one day.  You have trouble swallowing. Get help right away if:  You have a fever.  You vomit blood or your vomit looks like coffee grounds.  You have bloody, black, or tarry stools.  You have a severe sore throat or you cannot swallow.  You have difficulty breathing.  You have severe pain in your chest or belly. This information is not intended to replace advice given to you by your health care provider. Make sure you discuss any questions you have with your health care provider. Document Released: 06/14/2012 Document Revised: 05/21/2016 Document Reviewed: 09/26/2015 Elsevier Interactive Patient Education  2017 [employer]  Hiatal Hernia A  hiatal hernia occurs when part of the stomach slides above the muscle that separates the abdomen from the chest (diaphragm). A person can be born with a hiatal hernia (congenital), or it may develop over time. In almost all cases of hiatal hernia, only the top part of the stomach pushes through the diaphragm. Many people have a hiatal hernia with no symptoms. The larger the hernia, the more likely it is that you will have symptoms. In some cases, a hiatal hernia allows stomach acid to flow back into the tube that carries food from your mouth to your stomach (esophagus). This may cause heartburn symptoms. Severe heartburn symptoms may mean that you have developed a condition called gastroesophageal reflux disease (GERD). What are the causes? This condition is caused by a weakness in the opening (hiatus) where the esophagus passes through the diaphragm to attach to the upper part of the stomach. A person may be born with a weakness in the hiatus, or a weakness can develop over time. What increases the risk? This condition is more likely to develop in:  Older people. Age is a major risk factor for a hiatal hernia, especially if you are over the age of 7.  Pregnant women.  People who are overweight.  People who have frequent constipation. What are the signs or symptoms? Symptoms of this condition usually develop in the form of GERD symptoms. Symptoms include:  Heartburn.  Belching.  Indigestion.  Trouble swallowing.  Coughing or wheezing.  Sore throat.  Hoarseness.  Chest pain.  Nausea and vomiting. How is this diagnosed? This condition may be diagnosed  during testing for GERD. Tests that may be done include:  X-rays of your stomach or chest.  An upper gastrointestinal (GI) series. This is an X-ray exam of your GI tract that is taken after you swallow a chalky liquid that shows up clearly on the X-ray.  Endoscopy. This is a procedure to look into your stomach using a thin,  flexible tube that has a tiny camera and light on the end of it. How is this treated? This condition may be treated by:  Dietary and lifestyle changes to help reduce GERD symptoms.  Medicines. These may include:  Over-the-counter antacids.  Medicines that make your stomach empty more quickly.  Medicines that block the production of stomach acid (H2 blockers).  Stronger medicines to reduce stomach acid (proton pump inhibitors).  Surgery to repair the hernia, if other treatments are not helping. If you have no symptoms, you may not need treatment. Follow these instructions at home: Lifestyle and activity   Do not use any products that contain nicotine or tobacco, such as cigarettes and e-cigarettes. If you need help quitting, ask your health care provider.  Try to achieve and maintain a healthy body weight.  Avoid putting pressure on your abdomen. Anything that puts pressure on your abdomen increases the amount of acid that may be pushed up into your esophagus.  Avoid bending over, especially after eating.  Raise the head of your bed by putting blocks under the legs. This keeps your head and esophagus higher than your stomach.  Do not wear tight clothing around your chest or stomach.  Try not to strain when having a bowel movement, when urinating, or when lifting heavy objects. Eating and drinking   Avoid foods that can worsen GERD symptoms. These may include:  Fatty foods, like fried foods.  Citrus fruits, like oranges or lemon.  Other foods and drinks that contain acid, like orange juice or tomatoes.  Spicy food.  Chocolate.  Eat frequent small meals instead of three large meals a day. This helps prevent your stomach from getting too full.  Eat slowly.  Do not lie down right after eating.  Do not eat 1-2 hours before bed.  Do not drink beverages with caffeine. These include cola, coffee, cocoa, and tea.  Do not drink alcohol. General instructions   Take  over-the-counter and prescription medicines only as told by your health care provider.  Keep all follow-up visits as told by your health care provider. This is important. Contact a health care provider if:  Your symptoms are not controlled with medicines or lifestyle changes.  You are having trouble swallowing.  You have coughing or wheezing that will not go away. Get help right away if:  Your pain is getting worse.  Your pain spreads to your arms, neck, jaw, teeth, or back.  You have shortness of breath.  You sweat for no reason.  You feel sick to your stomach (nauseous) or you vomit.  You vomit blood.  You have bright red blood in your stools.  You have black, tarry stools. This information is not intended to replace advice given to you by your health care provider. Make sure you discuss any questions you have with your health care provider. Document Released: 03/05/2004 Document Revised: 12/07/2016 Document Reviewed: 12/07/2016 Elsevier Interactive Patient Education  2017 [employer].

## 2017-05-26 NOTE — Op Note (Signed)
[facility] Patient Name: Judy Mitchell Procedure Date: 05/26/2017 1:21 PM MRN: [id number] Date of Birth: 1951-02-10 Attending MD: Hildred Laser , MD CSN: [id number] Age: 66 Admit Type: Outpatient Procedure:                Upper GI endoscopy Indications:              Esophageal dysphagia, Follow-up of                            gastro-esophageal reflux disease Providers:                Hildred Laser, MD, Janeece Riggers, RN, Lurline Del, RN Referring MD:             Tawni Carnes PA, PA Medicines:                Lidocaine spray, Meperidine 50 mg IV, Midazolam 8                            mg IV Complications:            No immediate complications. Estimated Blood Loss:     Estimated blood loss: none. Procedure:                Pre-Anesthesia Assessment:                           - Prior to the procedure, a History and Physical                            was performed, and patient medications and                            allergies were reviewed. The patient's tolerance of                            previous anesthesia was also reviewed. The risks                            and benefits of the procedure and the sedation                            options and risks were discussed with the patient.                            All questions were answered, and informed consent                            was obtained. Prior Anticoagulants: The patient                            last took aspirin 3 days prior to the procedure.                            ASA Grade Assessment: II - A patient with mild  systemic disease. After reviewing the risks and                            benefits, the patient was deemed in satisfactory                            condition to undergo the procedure.                           After obtaining informed consent, the endoscope was                            passed under direct vision. Throughout the                            procedure,  the patient's blood pressure, pulse, and                            oxygen saturations were monitored continuously. The                            EG-[id number] ([id number]) scope was introduced through the                            mouth, and advanced to the second part of duodenum.                            The upper GI endoscopy was accomplished without                            difficulty. The patient tolerated the procedure                            well. Scope In: 1:59:58 PM Scope Out: 2:08:20 PM Total Procedure Duration: 0 hours 8 minutes 22 seconds  Findings:      The examined esophagus was normal.      The Z-line was irregular and was found 30 cm from the incisors.      An 8 cm hiatal hernia was present.      The entire examined stomach was normal.      The duodenal bulb and second portion of the duodenum were normal. Impression:               - Normal esophagus.                           - Z-line irregular, 30 cm from the incisors.                           - 8 cm hiatal hernia.                           - Normal stomach.                           - Normal duodenal bulb and  second portion of the                            duodenum.                           - No specimens collected.                           Comment: No esophageal ring or stricture noted.                            Esophageal dilation not attempted because of                            somewhat tortuous esophagus. Moderate Sedation:      Moderate (conscious) sedation was administered by the endoscopy nurse       and supervised by the endoscopist. The following parameters were       monitored: oxygen saturation, heart rate, blood pressure, CO2       capnography and response to care. Total physician intraservice time was       14 minutes. Recommendation:           - Patient has a contact number available for                            emergencies. The signs and symptoms of potential                             delayed complications were discussed with the                            patient. Return to normal activities tomorrow.                            Written discharge instructions were provided to the                            patient.                           - Resume previous diet today.                           - Continue present medications.                           - Return to GI clinic in 3 months. Procedure Code(s):        --- Professional ---                           [phone], Esophagogastroduodenoscopy, flexible,                            transoral; diagnostic, including collection of  specimen(s) by brushing or washing, when performed                            (separate procedure)                           99152, Moderate sedation services provided by the                            same physician or other qualified health care                            professional performing the diagnostic or                            therapeutic service that the sedation supports,                            requiring the presence of an independent trained                            observer to assist in the monitoring of the                            patient's level of consciousness and physiological                            status; initial 15 minutes of intraservice time,                            patient age 35 years or older Diagnosis Code(s):        --- Professional ---                           K22.8, Other specified diseases of esophagus                           K44.9, Diaphragmatic hernia without obstruction or                            gangrene                           R13.14, Dysphagia, pharyngoesophageal phase                           K21.9, Gastro-esophageal reflux disease without                            esophagitis CPT copyright 2016 American Medical Association. All rights reserved. The codes documented in this report are preliminary and  upon coder review may  be revised to meet current compliance requirements. Hildred Laser, MD Hildred Laser, MD 05/26/2017 2:25:11 PM This report has been signed electronically. Number of Addenda: 0

## 2017-05-27 ENCOUNTER — Encounter (INDEPENDENT_AMBULATORY_CARE_PROVIDER_SITE_OTHER): Payer: Self-pay | Admitting: Internal Medicine

## 2017-05-28 ENCOUNTER — Encounter (HOSPITAL_COMMUNITY): Payer: Self-pay | Admitting: Internal Medicine

## 2017-05-31 DIAGNOSIS — Z85828 Personal history of other malignant neoplasm of skin: Secondary | ICD-10-CM | POA: Diagnosis not present

## 2017-05-31 DIAGNOSIS — C44319 Basal cell carcinoma of skin of other parts of face: Secondary | ICD-10-CM | POA: Diagnosis not present

## 2017-06-01 ENCOUNTER — Other Ambulatory Visit: Payer: Self-pay | Admitting: Radiology

## 2017-06-01 DIAGNOSIS — S92342D Displaced fracture of fourth metatarsal bone, left foot, subsequent encounter for fracture with routine healing: Secondary | ICD-10-CM

## 2017-06-02 ENCOUNTER — Encounter: Payer: Self-pay | Admitting: Orthopaedic Surgery

## 2017-06-02 ENCOUNTER — Ambulatory Visit (INDEPENDENT_AMBULATORY_CARE_PROVIDER_SITE_OTHER): Payer: Medicare Other

## 2017-06-02 ENCOUNTER — Ambulatory Visit (INDEPENDENT_AMBULATORY_CARE_PROVIDER_SITE_OTHER): Payer: Self-pay | Admitting: Orthopaedic Surgery

## 2017-06-02 DIAGNOSIS — S92342D Displaced fracture of fourth metatarsal bone, left foot, subsequent encounter for fracture with routine healing: Secondary | ICD-10-CM

## 2017-06-02 NOTE — Progress Notes (Signed)
CC:  My foot is better  She is walking well.  She has some tenderness at times of the left foot.  She has no swelling now.  NV intact.  Gait normal.  X-rays were done, reported separately.  Encounter Diagnosis  Name Primary?  . Closed displaced fracture of fourth metatarsal bone of left foot with routine healing, subsequent encounter Yes   I will see her as needed.  Electronically Signed Sanjuana Kava, MD 6/6/20188:31 AM

## 2017-06-16 DIAGNOSIS — Z6828 Body mass index (BMI) 28.0-28.9, adult: Secondary | ICD-10-CM | POA: Diagnosis not present

## 2017-06-16 DIAGNOSIS — W57XXXA Bitten or stung by nonvenomous insect and other nonvenomous arthropods, initial encounter: Secondary | ICD-10-CM | POA: Diagnosis not present

## 2017-06-16 DIAGNOSIS — S60862A Insect bite (nonvenomous) of left wrist, initial encounter: Secondary | ICD-10-CM | POA: Diagnosis not present

## 2017-06-22 DIAGNOSIS — Z1231 Encounter for screening mammogram for malignant neoplasm of breast: Secondary | ICD-10-CM | POA: Diagnosis not present

## 2017-07-08 ENCOUNTER — Ambulatory Visit (INDEPENDENT_AMBULATORY_CARE_PROVIDER_SITE_OTHER): Payer: Medicare Other | Admitting: Orthopaedic Surgery

## 2017-07-08 ENCOUNTER — Encounter: Payer: Self-pay | Admitting: Orthopaedic Surgery

## 2017-07-08 VITALS — BP 161/77 | HR 62 | Temp 97.0°F | Ht 66.0 in | Wt 170.0 lb

## 2017-07-08 DIAGNOSIS — G8929 Other chronic pain: Secondary | ICD-10-CM | POA: Diagnosis not present

## 2017-07-08 DIAGNOSIS — M25561 Pain in right knee: Secondary | ICD-10-CM | POA: Diagnosis not present

## 2017-07-08 NOTE — Progress Notes (Signed)
CC:  I have pain of my right knee. I would like an injection.  The patient has chronic pain of the right knee.  There is no recent trauma.  There is no redness.  Injections in the past have helped.  The knee has no redness, has an effusion and crepitus present.  ROM of the right knee is 0-115.  Impression:  Chronic knee pain right  Return: prn  PROCEDURE NOTE:  The patient requests injections of the right knee , verbal consent was obtained.  The right knee was prepped appropriately after time out was performed.   Sterile technique was observed and injection of 1 cc of Depo-Medrol 40 mg with several cc's of plain xylocaine. Anesthesia was provided by ethyl chloride and a 20-gauge needle was used to inject the knee area. The injection was tolerated well.  A band aid dressing was applied.  The patient was advised to apply ice later today and tomorrow to the injection sight as needed.  Electronically Signed Sanjuana Kava, MD 7/12/20188:38 AM

## 2017-08-31 ENCOUNTER — Ambulatory Visit (INDEPENDENT_AMBULATORY_CARE_PROVIDER_SITE_OTHER): Payer: Medicare Other | Admitting: Internal Medicine

## 2017-08-31 ENCOUNTER — Encounter (INDEPENDENT_AMBULATORY_CARE_PROVIDER_SITE_OTHER): Payer: Self-pay | Admitting: Internal Medicine

## 2017-08-31 VITALS — BP 170/80 | HR 65 | Temp 97.4°F | Ht 66.5 in | Wt 173.5 lb

## 2017-08-31 DIAGNOSIS — K219 Gastro-esophageal reflux disease without esophagitis: Secondary | ICD-10-CM

## 2017-08-31 NOTE — Patient Instructions (Signed)
Continue the Omeprazole and Pecid as needed. OV in 1 year.

## 2017-08-31 NOTE — Progress Notes (Signed)
Subjective:    Patient ID: Judy Mitchell, female    DOB: 01-12-1951, 66 y.o.   MRN: [id number]  HPI  Here today for f/u.  Underwent an EGD in May of this year dysphagia/GERD. Impression:               - Normal esophagus.                           - Z-line irregular, 30 cm from the incisors.                           - 8 cm hiatal hernia.                           - Normal stomach.                           - Normal duodenal bulb and second portion of the                            duodenum.                           - No specimens collected.                           Comment: No esophageal ring or stricture noted.                            Esophageal dilation not attempted because of                            somewhat tortuous esophagus.   She tells me she is doing good. About twice a week she has pain in her epigastric region and radiates into her left shoulder. She cannot tell me what food causes this pain.  Acid reflux is controlled. No change in her weight. BMs normal.  She avoid Chocolate.    02/2017 DGD esophagram:  IMPRESSION: Large hiatal hernia.  Age-related esophageal dysmotility without evidence of stricture or obstruction.    Review of Systems Past Medical History:  Diagnosis Date  . Hiatal hernia 04/13/2017  . High blood pressure 02/23/2017  . PONV (postoperative nausea and vomiting)     Past Surgical History:  Procedure Laterality Date  . ABDOMINAL HYSTERECTOMY    . CHOLECYSTECTOMY    . complete hysterectomy    . endometriosis surgery    . ESOPHAGOGASTRODUODENOSCOPY N/A 05/26/2017   Procedure: ESOPHAGOGASTRODUODENOSCOPY (EGD);  Surgeon: Rogene Houston, MD;  Location: [facility];  Service: Endoscopy;  Laterality: N/A;  2:10  . KNEE SURGERY Right   . TONSILLECTOMY      Allergies  Allergen Reactions  . Sulfa Antibiotics Itching and Rash    Current Outpatient Prescriptions on File Prior to Visit  Medication Sig Dispense Refill  . ALPRAZolam  (XANAX) 0.25 MG tablet Take 0.125 mg by mouth 2 (two) times daily as needed for anxiety.     Marland Kitchen aspirin EC 81 MG tablet Take 81 mg by mouth daily as needed for mild pain.    . famotidine (PEPCID) 20 MG tablet Take 20 mg  by mouth as needed.     Marland Kitchen losartan (COZAAR) 50 MG tablet Take 50 mg by mouth daily.  2  . omeprazole (PRILOSEC) 40 MG capsule Take 1 capsule (40 mg total) by mouth daily. 90 capsule 3   No current facility-administered medications on file prior to visit.         Objective:   Physical Exam Blood pressure (!) 170/80, pulse 65, temperature (!) 97.4 F (36.3 C), height 5' 6.5" (1.689 m), weight 173 lb 8 oz (78.7 kg). Alert and oriented. Skin warm and dry. Oral mucosa is moist.   . Sclera anicteric, conjunctivae is pink. Thyroid not enlarged. No cervical lymphadenopathy. Lungs clear. Heart regular rate and rhythm.  Abdomen is soft. Bowel sounds are positive. No hepatomegaly. No abdominal masses felt. No tenderness.  No edema to lower extremities.            Assessment & Plan:  GERD. Continue the Protonix and Pepcid as needed. Avoid gas producing foods that are known to cause bloating. OV 1 year.

## 2017-10-04 DIAGNOSIS — Z1389 Encounter for screening for other disorder: Secondary | ICD-10-CM | POA: Diagnosis not present

## 2017-10-04 DIAGNOSIS — Z6827 Body mass index (BMI) 27.0-27.9, adult: Secondary | ICD-10-CM | POA: Diagnosis not present

## 2017-10-04 DIAGNOSIS — F419 Anxiety disorder, unspecified: Secondary | ICD-10-CM | POA: Diagnosis not present

## 2017-10-04 DIAGNOSIS — I10 Essential (primary) hypertension: Secondary | ICD-10-CM | POA: Diagnosis not present

## 2017-10-04 DIAGNOSIS — F329 Major depressive disorder, single episode, unspecified: Secondary | ICD-10-CM | POA: Diagnosis not present

## 2017-10-05 DIAGNOSIS — Z23 Encounter for immunization: Secondary | ICD-10-CM | POA: Diagnosis not present

## 2017-11-25 DIAGNOSIS — Z79899 Other long term (current) drug therapy: Secondary | ICD-10-CM | POA: Diagnosis not present

## 2017-11-25 DIAGNOSIS — R079 Chest pain, unspecified: Secondary | ICD-10-CM | POA: Diagnosis not present

## 2017-11-25 DIAGNOSIS — K449 Diaphragmatic hernia without obstruction or gangrene: Secondary | ICD-10-CM | POA: Diagnosis not present

## 2017-11-25 DIAGNOSIS — K219 Gastro-esophageal reflux disease without esophagitis: Secondary | ICD-10-CM | POA: Diagnosis not present

## 2017-11-25 DIAGNOSIS — Z6827 Body mass index (BMI) 27.0-27.9, adult: Secondary | ICD-10-CM | POA: Diagnosis not present

## 2017-11-25 DIAGNOSIS — F329 Major depressive disorder, single episode, unspecified: Secondary | ICD-10-CM | POA: Diagnosis not present

## 2017-11-25 DIAGNOSIS — F419 Anxiety disorder, unspecified: Secondary | ICD-10-CM | POA: Diagnosis not present

## 2017-11-25 DIAGNOSIS — F439 Reaction to severe stress, unspecified: Secondary | ICD-10-CM | POA: Diagnosis not present

## 2017-11-25 DIAGNOSIS — I10 Essential (primary) hypertension: Secondary | ICD-10-CM | POA: Diagnosis not present

## 2017-12-29 DIAGNOSIS — J019 Acute sinusitis, unspecified: Secondary | ICD-10-CM | POA: Diagnosis not present

## 2017-12-29 DIAGNOSIS — Z6827 Body mass index (BMI) 27.0-27.9, adult: Secondary | ICD-10-CM | POA: Diagnosis not present

## 2018-01-24 DIAGNOSIS — Z6827 Body mass index (BMI) 27.0-27.9, adult: Secondary | ICD-10-CM | POA: Diagnosis not present

## 2018-01-24 DIAGNOSIS — I10 Essential (primary) hypertension: Secondary | ICD-10-CM | POA: Diagnosis not present

## 2018-01-24 DIAGNOSIS — K219 Gastro-esophageal reflux disease without esophagitis: Secondary | ICD-10-CM | POA: Diagnosis not present

## 2018-01-24 DIAGNOSIS — R1013 Epigastric pain: Secondary | ICD-10-CM | POA: Diagnosis not present

## 2018-01-24 DIAGNOSIS — F419 Anxiety disorder, unspecified: Secondary | ICD-10-CM | POA: Diagnosis not present

## 2018-01-24 DIAGNOSIS — F329 Major depressive disorder, single episode, unspecified: Secondary | ICD-10-CM | POA: Diagnosis not present

## 2018-02-01 DIAGNOSIS — L570 Actinic keratosis: Secondary | ICD-10-CM | POA: Diagnosis not present

## 2018-02-01 DIAGNOSIS — L821 Other seborrheic keratosis: Secondary | ICD-10-CM | POA: Diagnosis not present

## 2018-02-01 DIAGNOSIS — C44722 Squamous cell carcinoma of skin of right lower limb, including hip: Secondary | ICD-10-CM | POA: Diagnosis not present

## 2018-02-01 DIAGNOSIS — L820 Inflamed seborrheic keratosis: Secondary | ICD-10-CM | POA: Diagnosis not present

## 2018-02-01 DIAGNOSIS — Z85828 Personal history of other malignant neoplasm of skin: Secondary | ICD-10-CM | POA: Diagnosis not present

## 2018-02-01 DIAGNOSIS — D485 Neoplasm of uncertain behavior of skin: Secondary | ICD-10-CM | POA: Diagnosis not present

## 2018-02-14 ENCOUNTER — Other Ambulatory Visit (INDEPENDENT_AMBULATORY_CARE_PROVIDER_SITE_OTHER): Payer: Self-pay | Admitting: Internal Medicine

## 2018-02-14 DIAGNOSIS — R1310 Dysphagia, unspecified: Secondary | ICD-10-CM

## 2018-02-14 DIAGNOSIS — R1319 Other dysphagia: Secondary | ICD-10-CM

## 2018-03-01 DIAGNOSIS — Z961 Presence of intraocular lens: Secondary | ICD-10-CM | POA: Diagnosis not present

## 2018-03-31 DIAGNOSIS — K219 Gastro-esophageal reflux disease without esophagitis: Secondary | ICD-10-CM | POA: Diagnosis not present

## 2018-03-31 DIAGNOSIS — F329 Major depressive disorder, single episode, unspecified: Secondary | ICD-10-CM | POA: Diagnosis not present

## 2018-03-31 DIAGNOSIS — Z681 Body mass index (BMI) 19 or less, adult: Secondary | ICD-10-CM | POA: Diagnosis not present

## 2018-03-31 DIAGNOSIS — I10 Essential (primary) hypertension: Secondary | ICD-10-CM | POA: Diagnosis not present

## 2018-03-31 DIAGNOSIS — R002 Palpitations: Secondary | ICD-10-CM | POA: Diagnosis not present

## 2018-03-31 DIAGNOSIS — F419 Anxiety disorder, unspecified: Secondary | ICD-10-CM | POA: Diagnosis not present

## 2018-05-02 ENCOUNTER — Other Ambulatory Visit: Payer: Self-pay | Admitting: Gastroenterology

## 2018-05-02 DIAGNOSIS — R1013 Epigastric pain: Secondary | ICD-10-CM

## 2018-05-02 DIAGNOSIS — K210 Gastro-esophageal reflux disease with esophagitis: Secondary | ICD-10-CM | POA: Diagnosis not present

## 2018-05-06 ENCOUNTER — Ambulatory Visit
Admission: RE | Admit: 2018-05-06 | Discharge: 2018-05-06 | Disposition: A | Discharge status: Home / Self Care | Payer: Medicare Other | Source: Ambulatory Visit | Attending: Gastroenterology | Admitting: Gastroenterology

## 2018-05-06 DIAGNOSIS — K5730 Diverticulosis of large intestine without perforation or abscess without bleeding: Secondary | ICD-10-CM | POA: Diagnosis not present

## 2018-05-06 DIAGNOSIS — R1013 Epigastric pain: Secondary | ICD-10-CM

## 2018-05-06 MED ORDER — IOPAMIDOL (ISOVUE-300) INJECTION 61%
100.0000 mL | Freq: Once | INTRAVENOUS | Status: AC | PRN
  Administered 2018-05-06: 100 mL via INTRAVENOUS

## 2018-06-17 ENCOUNTER — Encounter: Payer: Self-pay | Admitting: Cardiovascular Disease

## 2018-06-17 ENCOUNTER — Ambulatory Visit (INDEPENDENT_AMBULATORY_CARE_PROVIDER_SITE_OTHER): Payer: Medicare Other | Admitting: Cardiovascular Disease

## 2018-06-17 VITALS — BP 138/76 | HR 71 | Ht 66.5 in | Wt 163.0 lb

## 2018-06-17 DIAGNOSIS — R002 Palpitations: Secondary | ICD-10-CM | POA: Insufficient documentation

## 2018-06-17 DIAGNOSIS — I10 Essential (primary) hypertension: Secondary | ICD-10-CM | POA: Diagnosis not present

## 2018-06-17 DIAGNOSIS — K219 Gastro-esophageal reflux disease without esophagitis: Secondary | ICD-10-CM

## 2018-06-17 NOTE — Progress Notes (Signed)
06/17/2018 Judy Mitchell   Mar 15, 1951  [id number]  Primary Physician Judy Kaufman, PA-C Primary Cardiologist: Lorretta Harp MD Judy Mitchell  HPI:  Judy Mitchell is a 67 y.o. very Caucasian female no children who is retired from working at the [employer].  She was referred by Judy Krill, PA-C for evaluation of PACs.  She has no prior cardiac history.  Her only risk factors include treated hypertension.  She is never had a heart attack or stroke.  She denies chest pain or shortness of breath.  She does have a hiatal hernia with GERD on omeprazole followed by Judy Mitchell got.  He noticed new onset palpitations approximately 6 months ago occurring in the early morning hours awaking feeling her from sleep frequently at around 4 AM 30/3 minutes at a time.  There are no other associated symptoms.  They are self-limited.  She has limited caffeine and chocolate from her diet.  Her thyroid function tests have been normal.   Current Meds  Medication Sig  . ALPRAZolam (XANAX) 0.25 MG tablet Take 0.125 mg by mouth 2 (two) times daily as needed for anxiety.   Marland Kitchen aspirin EC 81 MG tablet Take 81 mg by mouth daily as needed for mild pain.  . famotidine (PEPCID) 20 MG tablet Take 20 mg by mouth as needed.   Marland Kitchen FLUoxetine (PROZAC) 40 MG capsule Take 1 capsule by mouth daily.  Marland Kitchen losartan (COZAAR) 50 MG tablet Take 50 mg by mouth daily.  Marland Kitchen omeprazole (PRILOSEC) 40 MG capsule TAKE 1 CAPSULE (40 MG TOTAL) BY MOUTH DAILY.     Allergies  Allergen Reactions  . Sulfa Antibiotics Itching and Rash    Social History   Socioeconomic History  . Marital status: Married    Spouse name: Not on file  . Number of children: Not on file  . Years of education: Not on file  . Highest education level: Not on file  Occupational History  . Not on file  Social Needs  . Financial resource strain: Not on file  . Food insecurity:    Worry: Not on file    Inability: Not on file  .  Transportation needs:    Medical: Not on file    Non-medical: Not on file  Tobacco Use  . Smoking status: Never Smoker  . Smokeless tobacco: Never Used  Substance and Sexual Activity  . Alcohol use: Yes    Comment: rarely  . Drug use: No  . Sexual activity: Not on file  Lifestyle  . Physical activity:    Days per week: Not on file    Minutes per session: Not on file  . Stress: Not on file  Relationships  . Social connections:    Talks on phone: Not on file    Gets together: Not on file    Attends religious service: Not on file    Active member of club or organization: Not on file    Attends meetings of clubs or organizations: Not on file    Relationship status: Not on file  . Intimate partner violence:    Fear of current or ex partner: Not on file    Emotionally abused: Not on file    Physically abused: Not on file    Forced sexual activity: Not on file  Other Topics Concern  . Not on file  Social History Narrative  . Not on file     Review of Systems: General:  negative for chills, fever, night sweats or weight changes.  Cardiovascular: negative for chest pain, dyspnea on exertion, edema, orthopnea, palpitations, paroxysmal nocturnal dyspnea or shortness of breath Dermatological: negative for rash Respiratory: negative for cough or wheezing Urologic: negative for hematuria Abdominal: negative for nausea, vomiting, diarrhea, bright red blood per rectum, melena, or hematemesis Neurologic: negative for visual changes, syncope, or dizziness All other systems reviewed and are otherwise negative except as noted above.    Blood pressure 138/76, pulse 71, height 5' 6.5" (1.689 m), weight 163 lb (73.9 kg).  General appearance: alert and no distress Neck: no adenopathy, no carotid bruit, no JVD, supple, symmetrical, trachea midline and thyroid not enlarged, symmetric, no tenderness/mass/nodules Lungs: clear to auscultation bilaterally Heart: regular rate and rhythm, S1, S2  normal, no murmur, click, rub or gallop Extremities: extremities normal, atraumatic, no cyanosis or edema Pulses: 2+ and symmetric Skin: Skin color, texture, turgor normal. No rashes or lesions Neurologic: Alert and oriented X 3, normal strength and tone. Normal symmetric reflexes. Normal coordination and gait  EKG sinus rhythm at 71 without ST or T wave changes.  I personally reviewed this EKG.  ASSESSMENT AND PLAN:   Palpitations Ms Butrick was referred to me by Judy Krill PA-C valuation of palpitations.  Palpitations began 6 months ago.  They typically occur early hours of the morning around 4 AM waking sleep.  She does not feel them throughout the day.  There are no other associated symptoms.  He lasts for minutes at a time and resolve spontaneously.  She has discontinued her caffeine intake as well as chocolate intake.  She thinks that some of these are stress related as well.  Her thyroid function test have been tested and normal.  I am going to get a 2D echo and a 30-day event monitor to further evaluate.  High blood pressure History of essential hypertension her blood pressure measured at 138/76.  She is on losartan 50 mg a day.  Continue current meds at current dosing.  GERD (gastroesophageal reflux disease) History of hiatal hernia with GERD on omeprazole followed by Dr. Watt Mitchell.      Lorretta Harp MD FACP,FACC,FAHA, [facility] 06/17/2018 8:33 AM

## 2018-06-17 NOTE — Assessment & Plan Note (Signed)
Ms Gasior was referred to me by Clemmie Krill PA-C valuation of palpitations.  Palpitations began 6 months ago.  They typically occur early hours of the morning around 4 AM waking sleep.  She does not feel them throughout the day.  There are no other associated symptoms.  He lasts for minutes at a time and resolve spontaneously.  She has discontinued her caffeine intake as well as chocolate intake.  She thinks that some of these are stress related as well.  Her thyroid function test have been tested and normal.  I am going to get a 2D echo and a 30-day event monitor to further evaluate.

## 2018-06-17 NOTE — Assessment & Plan Note (Signed)
History of essential hypertension her blood pressure measured at 138/76.  She is on losartan 50 mg a day.  Continue current meds at current dosing.

## 2018-06-17 NOTE — Patient Instructions (Signed)
Medication Instructions: Your physician recommends that you continue on your current medications as directed. Please refer to the Current Medication list given to you today.   Testing/Procedures: Your physician has requested that you have an echocardiogram. Echocardiography is a painless test that uses sound waves to create images of your heart. It provides your doctor with information about the size and shape of your heart and how well your heart's chambers and valves are working. This procedure takes approximately one hour. There are no restrictions for this procedure.  Your physician has recommended that you wear a 30 day event monitor. Event monitors are medical devices that record the heart's electrical activity. Doctors most often [place] these monitors to diagnose arrhythmias. Arrhythmias are problems with the speed or rhythm of the heartbeat. The monitor is a small, portable device. You can wear one while you do your normal daily activities. This is usually used to diagnose what is causing palpitations/syncope (passing out).  Follow-Up: Your physician recommends that you schedule a follow-up appointment in: 6-8 weeks with Dr. Gwenlyn Found.

## 2018-06-17 NOTE — Assessment & Plan Note (Signed)
History of hiatal hernia with GERD on omeprazole followed by Dr. Watt Climes.

## 2018-06-20 IMAGING — CT CT ABD-PELV W/ CM
2 of 5 series · 14 of 46 positions shown, 16 images · IV contrast (iopamidol)
Comparison: 09/24/08

CLINICAL DATA: Epigastric pain for 1 year.

EXAM:
CT ABDOMEN AND PELVIS WITH CONTRAST
TECHNIQUE: Multidetector CT imaging of the abdomen and pelvis was performed
using the standard protocol following bolus administration of
intravenous contrast.
CONTRAST:  100mL RY9WBU-HFF IOPAMIDOL (RY9WBU-HFF) INJECTION 61%
Creatinine was obtained on site at [HOSPITAL] at [REDACTED].
Results: Creatinine 0.6 mg/dL.

[Series 2: abd pelvis 5.00 br40 s3 ax · axial · 0.69mm/px · z∈[+1373,+1808]mm · 11 of 99 slices shown, 13 images]
[im 6/99  soft-tissue]
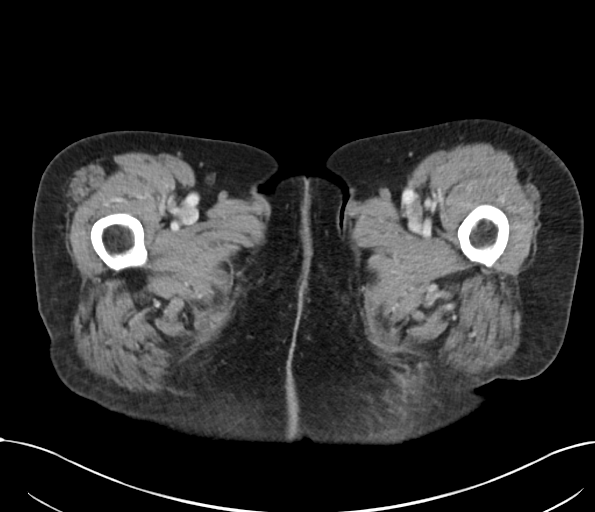
[im 6/99  bone]
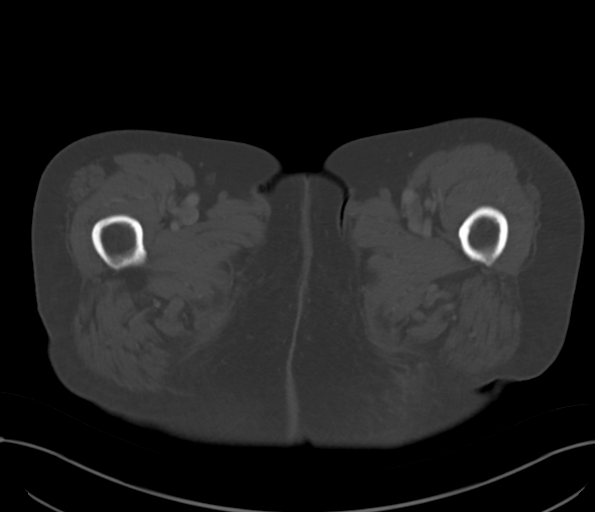
[im 16/99  soft-tissue]
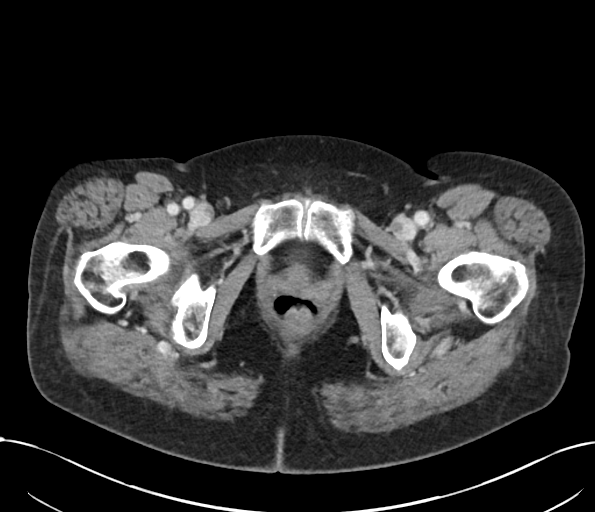
[im 26/99  soft-tissue]
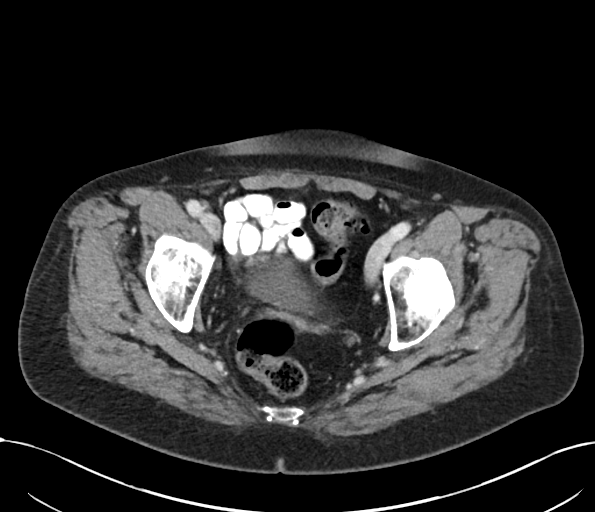
[im 31/99  soft-tissue]
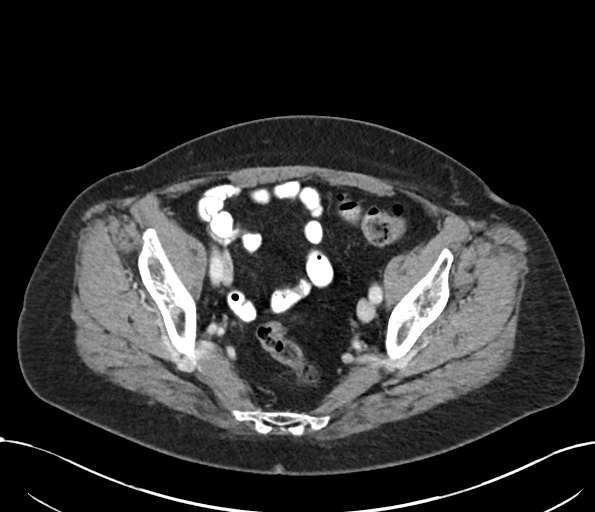
[im 42/99  soft-tissue]
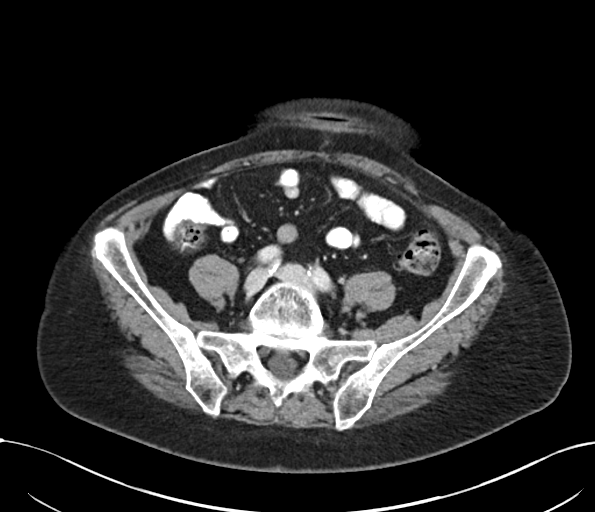
[im 52/99  soft-tissue]
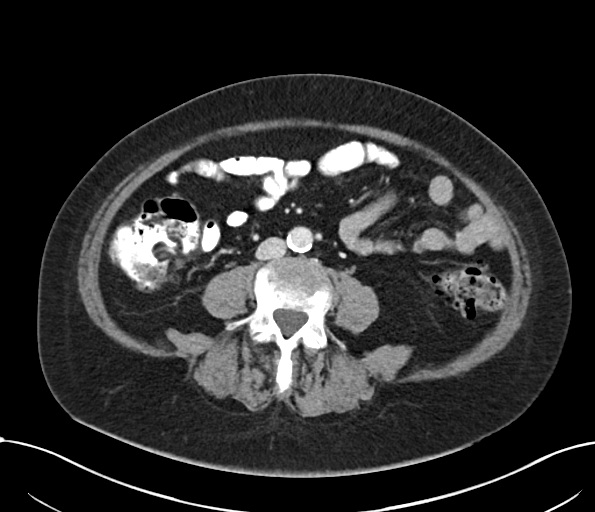
[im 57/99  soft-tissue]
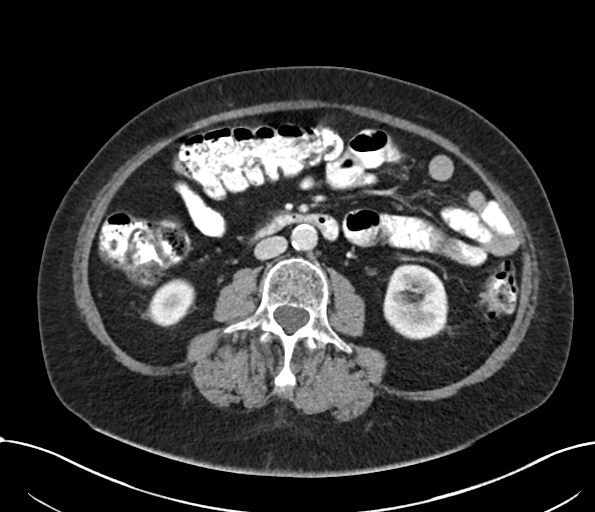
[im 68/99  soft-tissue]
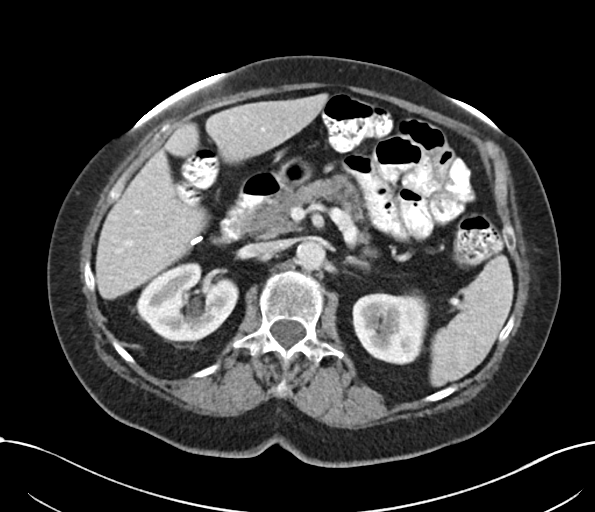
[im 73/99  soft-tissue]
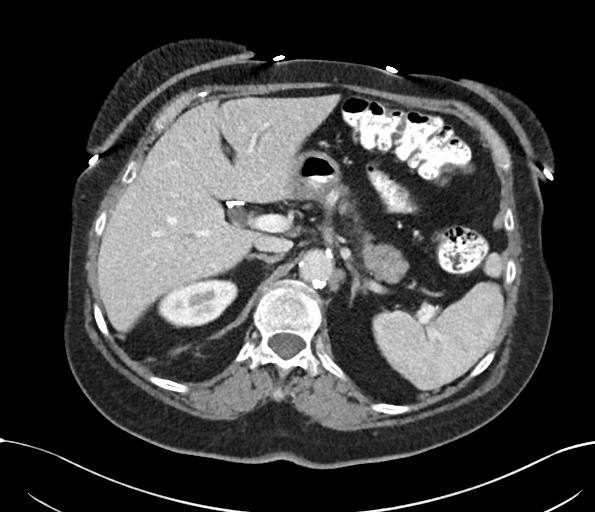
[im 73/99  bone]
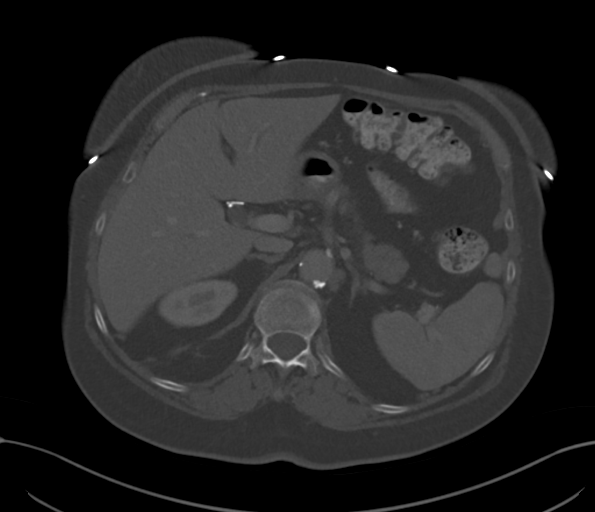
[im 83/99  soft-tissue]
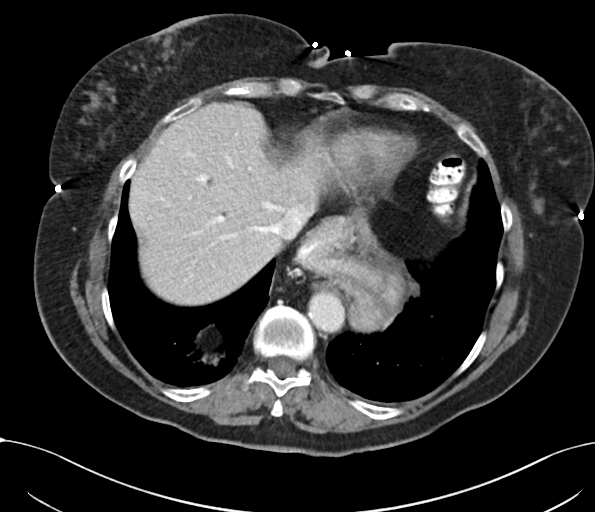
[im 93/99  soft-tissue]
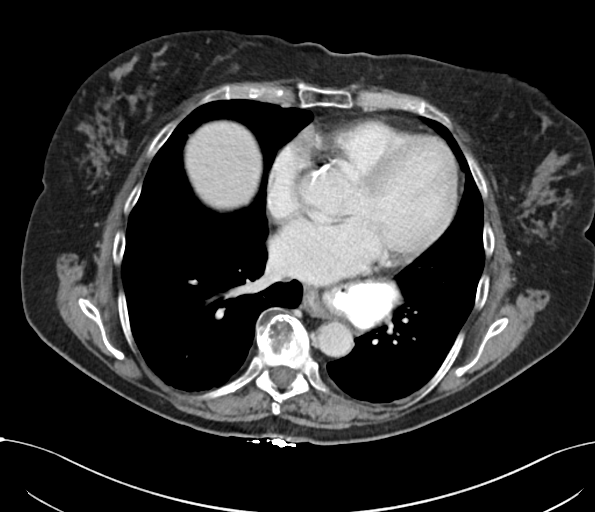

[Series 6: abd pelvis 2.00 br40 s3 cor · coronal · 0.80mm/px · 3 of 177 slices shown]
[im 59/177  soft-tissue]
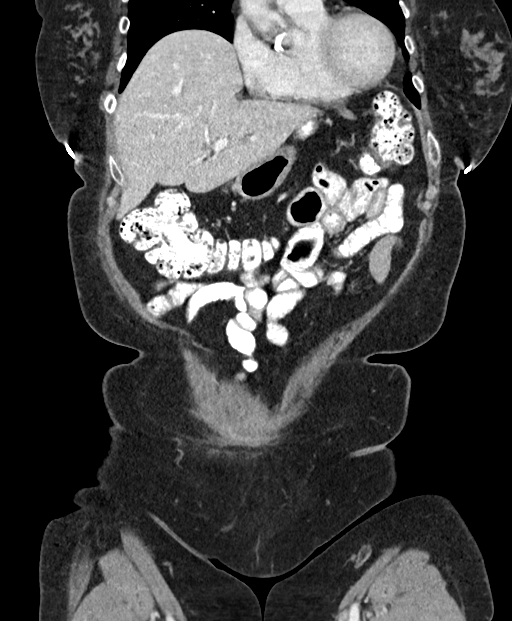
[im 79/177  soft-tissue]
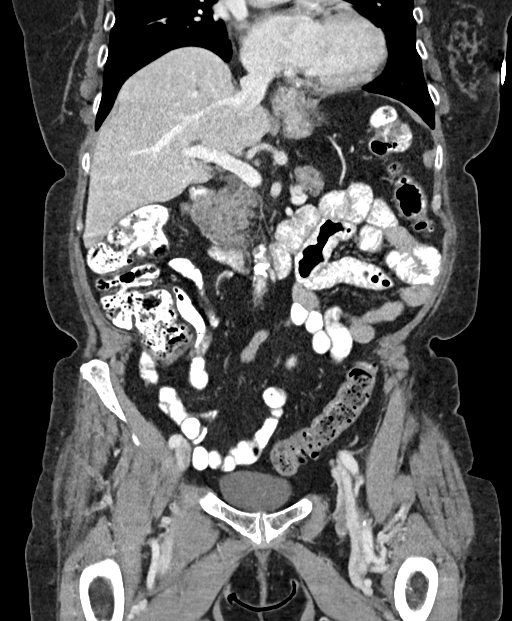
[im 98/177  soft-tissue]
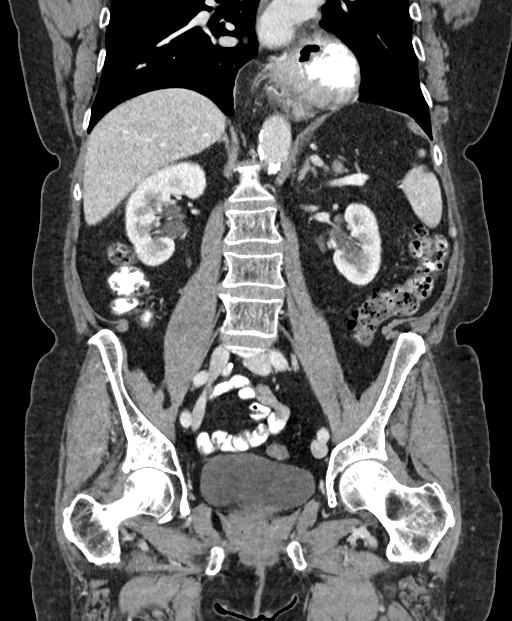

[14 of 46 positions shown; findings below may reference images not displayed]

FINDINGS: Lower chest: No acute abnormality. Fat containing right posterior
diaphragmatic hernia noted. Large hiatal hernia is also identified.
Calcifications in the LAD and RCA coronary arteries. Aortic
atherosclerosis.

Hepatobiliary: Previous cholecystectomy. Mild increase caliber of
the common bile duct which measures 1.3 cm. No choledocholithiasis
or mass noted.

Pancreas: Unremarkable. No pancreatic ductal dilatation or
surrounding inflammatory changes.

Spleen: Normal in size without focal abnormality.

Adrenals/Urinary Tract: Normal adrenal glands.

Bilateral renal sinus cysts identified. No mass or hydronephrosis
identified. The urinary bladder is unremarkable.

Stomach/Bowel: Large hiatal hernia. The small bowel loops have a
normal course and caliber without obstruction. Distal colonic
diverticula noted without acute inflammation.

Vascular/Lymphatic: Aortic atherosclerosis. No aneurysm. No upper
abdominal adenopathy. No pelvic or inguinal adenopathy.

Reproductive: Status post hysterectomy. No adnexal masses.

Other: There is no ascites or focal fluid collections within the
abdomen or pelvis.

Musculoskeletal: Degenerative disc disease identified at L5-S1. No
suspicious bone lesions.
IMPRESSION: 1. No acute findings within the abdomen or pelvis.
2. Previous cholecystectomy with increase caliber of the common bile
duct measuring 13 mm. No common bile duct stone or mass noted.
3. Large hiatal hernia
4. Distal colonic diverticula without acute inflammation.
5. Aortic Atherosclerosis ([id number]). LAD and RCA coronary artery
atherosclerotic calcifications.

## 2018-06-27 ENCOUNTER — Ambulatory Visit (INDEPENDENT_AMBULATORY_CARE_PROVIDER_SITE_OTHER): Payer: Medicare Other

## 2018-06-27 ENCOUNTER — Other Ambulatory Visit: Payer: Self-pay

## 2018-06-27 ENCOUNTER — Ambulatory Visit (HOSPITAL_COMMUNITY): Payer: Medicare Other | Attending: Cardiovascular Disease

## 2018-06-27 DIAGNOSIS — I088 Other rheumatic multiple valve diseases: Secondary | ICD-10-CM | POA: Diagnosis not present

## 2018-06-27 DIAGNOSIS — R002 Palpitations: Secondary | ICD-10-CM | POA: Diagnosis not present

## 2018-06-28 ENCOUNTER — Other Ambulatory Visit: Payer: Self-pay | Admitting: *Deleted

## 2018-06-28 DIAGNOSIS — R002 Palpitations: Secondary | ICD-10-CM | POA: Diagnosis not present

## 2018-06-28 DIAGNOSIS — I10 Essential (primary) hypertension: Secondary | ICD-10-CM | POA: Diagnosis not present

## 2018-06-28 DIAGNOSIS — K219 Gastro-esophageal reflux disease without esophagitis: Secondary | ICD-10-CM | POA: Diagnosis not present

## 2018-06-28 DIAGNOSIS — F329 Major depressive disorder, single episode, unspecified: Secondary | ICD-10-CM | POA: Diagnosis not present

## 2018-06-28 DIAGNOSIS — I359 Nonrheumatic aortic valve disorder, unspecified: Secondary | ICD-10-CM

## 2018-07-01 DIAGNOSIS — K219 Gastro-esophageal reflux disease without esophagitis: Secondary | ICD-10-CM | POA: Diagnosis not present

## 2018-07-01 DIAGNOSIS — F329 Major depressive disorder, single episode, unspecified: Secondary | ICD-10-CM | POA: Diagnosis not present

## 2018-07-01 DIAGNOSIS — I10 Essential (primary) hypertension: Secondary | ICD-10-CM | POA: Diagnosis not present

## 2018-07-01 DIAGNOSIS — Z6826 Body mass index (BMI) 26.0-26.9, adult: Secondary | ICD-10-CM | POA: Diagnosis not present

## 2018-07-01 DIAGNOSIS — F419 Anxiety disorder, unspecified: Secondary | ICD-10-CM | POA: Diagnosis not present

## 2018-07-01 DIAGNOSIS — R1013 Epigastric pain: Secondary | ICD-10-CM | POA: Diagnosis not present

## 2018-07-01 DIAGNOSIS — E782 Mixed hyperlipidemia: Secondary | ICD-10-CM | POA: Diagnosis not present

## 2018-08-10 DIAGNOSIS — H26493 Other secondary cataract, bilateral: Secondary | ICD-10-CM | POA: Diagnosis not present

## 2018-08-10 DIAGNOSIS — Z961 Presence of intraocular lens: Secondary | ICD-10-CM | POA: Diagnosis not present

## 2018-08-16 ENCOUNTER — Ambulatory Visit (INDEPENDENT_AMBULATORY_CARE_PROVIDER_SITE_OTHER): Payer: Medicare Other | Admitting: Cardiovascular Disease

## 2018-08-16 ENCOUNTER — Encounter: Payer: Self-pay | Admitting: Cardiovascular Disease

## 2018-08-16 VITALS — BP 128/72 | HR 94 | Ht 66.0 in | Wt 162.2 lb

## 2018-08-16 DIAGNOSIS — I359 Nonrheumatic aortic valve disorder, unspecified: Secondary | ICD-10-CM | POA: Diagnosis not present

## 2018-08-16 DIAGNOSIS — E7800 Pure hypercholesterolemia, unspecified: Secondary | ICD-10-CM

## 2018-08-16 DIAGNOSIS — E785 Hyperlipidemia, unspecified: Secondary | ICD-10-CM | POA: Insufficient documentation

## 2018-08-16 DIAGNOSIS — Z01818 Encounter for other preprocedural examination: Secondary | ICD-10-CM | POA: Diagnosis not present

## 2018-08-16 DIAGNOSIS — R002 Palpitations: Secondary | ICD-10-CM

## 2018-08-16 DIAGNOSIS — I351 Nonrheumatic aortic (valve) insufficiency: Secondary | ICD-10-CM | POA: Insufficient documentation

## 2018-08-16 NOTE — Progress Notes (Signed)
08/16/2018 Judy Mitchell   [birth year]  [id number]  Primary Physician Judy Kaufman, PA-C Primary Cardiologist: Lorretta Harp MD Judy Mitchell, [place]  HPI:  Judy Mitchell is a 67 y.o.  very Caucasian female no children who is retired from working at the [employer].  She was referred by Judy Krill, PA-C for evaluation of PACs.  I last saw her in the office 06/17/2018. She has no prior cardiac history.  Her only risk factors include treated hypertension.  She is never had a heart attack or stroke.  She denies chest pain or shortness of breath.  She does have a hiatal hernia with GERD on omeprazole followed by Dr. Trilby Leaver   She noticed new onset palpitations approximately 6 months ago occurring in the early morning hours awaking feeling her from sleep frequently at around 4 AM .   There are no other associated symptoms.  They are self-limited.  She has limited caffeine and chocolate from her diet.  Her thyroid function tests have been normal. She is under a lot of stress at home with an ailing husband.  Her palpitations have improved with the addition of Prozac, Xanax and elimination of caffeine.  A 2D echo was performed that showed normal LV systolic function, mild LV dilatation with mild to moderate AI and mild to moderate pulmonary hypertension.  She apparently does need hiatal hernia surgical repair as well.  Current Meds  Medication Sig  . ALPRAZolam (XANAX) 0.25 MG tablet Take 0.125 mg by mouth 2 (two) times daily as needed for anxiety.   Marland Kitchen aspirin EC 81 MG tablet Take 81 mg by mouth daily as needed for mild pain.  . famotidine (PEPCID) 20 MG tablet Take 20 mg by mouth as needed.   Marland Kitchen FLUoxetine (PROZAC) 40 MG capsule Take 1 capsule by mouth daily.  Marland Kitchen losartan (COZAAR) 50 MG tablet Take 50 mg by mouth daily.  Marland Kitchen omeprazole (PRILOSEC) 40 MG capsule TAKE 1 CAPSULE (40 MG TOTAL) BY MOUTH DAILY. (Patient taking differently: Take 40 mg by mouth as needed. )      Allergies  Allergen Reactions  . Sulfa Antibiotics Itching and Rash    Social History   Socioeconomic History  . Marital status: Married    Spouse name: Not on file  . Number of children: Not on file  . Years of education: Not on file  . Highest education level: Not on file  Occupational History  . Not on file  Social Needs  . Financial resource strain: Not on file  . Food insecurity:    Worry: Not on file    Inability: Not on file  . Transportation needs:    Medical: Not on file    Non-medical: Not on file  Tobacco Use  . Smoking status: Never Smoker  . Smokeless tobacco: Never Used  Substance and Sexual Activity  . Alcohol use: Yes    Comment: rarely  . Drug use: No  . Sexual activity: Not on file  Lifestyle  . Physical activity:    Days per week: Not on file    Minutes per session: Not on file  . Stress: Not on file  Relationships  . Social connections:    Talks on phone: Not on file    Gets together: Not on file    Attends religious service: Not on file    Active member of club or organization: Not on file    Attends meetings of clubs  or organizations: Not on file    Relationship status: Not on file  . Intimate partner violence:    Fear of current or ex partner: Not on file    Emotionally abused: Not on file    Physically abused: Not on file    Forced sexual activity: Not on file  Other Topics Concern  . Not on file  Social History Narrative  . Not on file     Review of Systems: General: negative for chills, fever, night sweats or weight changes.  Cardiovascular: negative for chest pain, dyspnea on exertion, edema, orthopnea, palpitations, paroxysmal nocturnal dyspnea or shortness of breath Dermatological: negative for rash Respiratory: negative for cough or wheezing Urologic: negative for hematuria Abdominal: negative for nausea, vomiting, diarrhea, bright red blood per rectum, melena, or hematemesis Neurologic: negative for visual changes,  syncope, or dizziness All other systems reviewed and are otherwise negative except as noted above.    Blood pressure 128/72, pulse 94, height 5\' 6"  (1.676 m), weight 162 lb 3.2 oz (73.6 kg).  General appearance: alert and no distress Neck: no adenopathy, no carotid bruit, no JVD, supple, symmetrical, trachea midline and thyroid not enlarged, symmetric, no tenderness/mass/nodules Lungs: clear to auscultation bilaterally Heart: regular rate and rhythm, S1, S2 normal, no murmur, click, rub or gallop Extremities: extremities normal, atraumatic, no cyanosis or edema Pulses: 2+ and symmetric Skin: Skin color, texture, turgor normal. No rashes or lesions Neurologic: Alert and oriented X 3, normal strength and tone. Normal symmetric reflexes. Normal coordination and gait  EKG not performed today  ASSESSMENT AND PLAN:   Aortic insufficiency [name] had a 2D echocardiogram performed 06/27/2018 that showed normal LV systolic function, mild LV dilatation with mild to moderate AI and mild to moderate increase in PA pressures.  She is asymptomatic.  We will continue to follow this on an annual basis.  Essential hypertension History of essential hypertension her blood pressure measured today at 128/72.  She is on losartan.  Palpitations History of palpitations which have improved with the addition of Prozac, Xanax and reducing caffeine intake.  30-day event monitor did not show any arrhythmias.  Hyperlipidemia Of hyperlipidemia with recent lipid profile performed 06/28/2018 that revealed an LDL of 136.  We have agreed to dietary modification with recheck in 3 months prior to initiating statin therapy.      Lorretta Harp MD FACP,FACC,FAHA, [facility] 08/16/2018 3:36 PM

## 2018-08-16 NOTE — Assessment & Plan Note (Signed)
History of palpitations which have improved with the addition of Prozac, Xanax and reducing caffeine intake.  30-day event monitor did not show any arrhythmias.

## 2018-08-16 NOTE — Assessment & Plan Note (Signed)
Judy Mitchell had a 2D echocardiogram performed 06/27/2018 that showed normal LV systolic function, mild LV dilatation with mild to moderate AI and mild to moderate increase in PA pressures.  She is asymptomatic.  We will continue to follow this on an annual basis.

## 2018-08-16 NOTE — Patient Instructions (Signed)
Medication Instructions:   NO CHANGE  Labwork:  Your physician recommends that you return for lab work PRIOR TO EATING  Testing/Procedures:  Your physician has requested that you have en exercise stress myoview. For further information please visit [email or web address]. Please follow instruction sheet, as given. TAKE ALL MEDICATIONS    Follow-Up:  Your physician wants you to follow-up in: [place] will receive a reminder letter in the mail two months in advance. If you don't receive a letter, please call our office to schedule the follow-up appointment.   If you need a refill on your cardiac medications before your next appointment, please call your pharmacy.

## 2018-08-16 NOTE — Assessment & Plan Note (Signed)
Of hyperlipidemia with recent lipid profile performed 06/28/2018 that revealed an LDL of 136.  We have agreed to dietary modification with recheck in 3 months prior to initiating statin therapy.

## 2018-08-16 NOTE — Assessment & Plan Note (Signed)
History of essential hypertension her blood pressure measured today at 128/72.  She is on losartan.

## 2018-08-18 ENCOUNTER — Telehealth (HOSPITAL_COMMUNITY): Payer: Self-pay

## 2018-08-18 NOTE — Telephone Encounter (Signed)
Encounter complete. 

## 2018-08-19 DIAGNOSIS — H26492 Other secondary cataract, left eye: Secondary | ICD-10-CM | POA: Diagnosis not present

## 2018-08-22 DIAGNOSIS — Z1231 Encounter for screening mammogram for malignant neoplasm of breast: Secondary | ICD-10-CM | POA: Diagnosis not present

## 2018-08-23 ENCOUNTER — Ambulatory Visit (HOSPITAL_COMMUNITY)
Admission: RE | Admit: 2018-08-23 | Discharge: 2018-08-23 | Disposition: A | Discharge status: Home / Self Care | Payer: Medicare Other | Source: Ambulatory Visit | Attending: Cardiology | Admitting: Cardiology

## 2018-08-23 DIAGNOSIS — R002 Palpitations: Secondary | ICD-10-CM | POA: Diagnosis not present

## 2018-08-23 DIAGNOSIS — Z01818 Encounter for other preprocedural examination: Secondary | ICD-10-CM | POA: Insufficient documentation

## 2018-08-23 DIAGNOSIS — I359 Nonrheumatic aortic valve disorder, unspecified: Secondary | ICD-10-CM | POA: Insufficient documentation

## 2018-08-23 LAB — MYOCARDIAL PERFUSION IMAGING
CHL CUP NUCLEAR SDS: 1
CHL CUP NUCLEAR SRS: 3
CHL CUP NUCLEAR SSS: 4
CSEPEDS: 10 s
CSEPEW: 10.3 METS
CSEPPHR: 141 {beats}/min
Exercise duration (min): 9 min
LVDIAVOL: 93 mL (ref 46–106)
LVSYSVOL: 42 mL
MPHR: 152 {beats}/min
NUC STRESS TID: 1.1
Percent HR: 92 %
RPE: 19
Rest HR: 60 {beats}/min

## 2018-08-23 MED ORDER — TECHNETIUM TC 99M TETROFOSMIN IV KIT
32.5000 | PACK | Freq: Once | INTRAVENOUS | Status: AC | PRN
  Administered 2018-08-23: 32.5 via INTRAVENOUS
  Filled 2018-08-23: qty 33

## 2018-08-23 MED ORDER — TECHNETIUM TC 99M TETROFOSMIN IV KIT
10.8000 | PACK | Freq: Once | INTRAVENOUS | Status: AC | PRN
  Administered 2018-08-23: 10.8 via INTRAVENOUS
  Filled 2018-08-23: qty 11

## 2018-08-31 ENCOUNTER — Ambulatory Visit (INDEPENDENT_AMBULATORY_CARE_PROVIDER_SITE_OTHER): Payer: Medicare Other | Admitting: Internal Medicine

## 2018-09-09 DIAGNOSIS — K449 Diaphragmatic hernia without obstruction or gangrene: Secondary | ICD-10-CM | POA: Diagnosis not present

## 2018-09-13 ENCOUNTER — Other Ambulatory Visit: Payer: Self-pay | Admitting: Surgery

## 2018-09-13 DIAGNOSIS — K838 Other specified diseases of biliary tract: Secondary | ICD-10-CM

## 2018-09-22 ENCOUNTER — Ambulatory Visit
Admission: RE | Admit: 2018-09-22 | Discharge: 2018-09-22 | Disposition: A | Discharge status: Home / Self Care | Payer: Medicare Other | Source: Ambulatory Visit | Attending: Surgery | Admitting: Surgery

## 2018-09-22 DIAGNOSIS — K838 Other specified diseases of biliary tract: Secondary | ICD-10-CM

## 2018-09-22 DIAGNOSIS — K831 Obstruction of bile duct: Secondary | ICD-10-CM | POA: Diagnosis not present

## 2018-09-27 NOTE — Progress Notes (Signed)
Need orders in epic.  Surgery on 10/03/2018.  Preop on 09/28/2018 at 130pm.

## 2018-09-27 NOTE — Progress Notes (Signed)
06/17/2018-ekg-epic  LOV( card)- Dr Berry-08/16/2018-epic Stress Test-08/23/18-epic  ECHO=-06/27/2018-epic

## 2018-09-27 NOTE — Patient Instructions (Addendum)
JOSEFINA RYNDERS  09/27/2018   Your procedure is scheduled on: 10/03/2018   Report to [facility] Main  Entrance  Report to admitting at    0730 AM    Call this number if you have problems the morning of surgery [phone]   Remember: Do not eat food or drink liquids :After Midnight. BRUSH YOUR TEETH MORNING OF SURGERY AND RINSE YOUR MOUTH OUT, NO CHEWING GUM CANDY OR MINTS.     Take these medicines the morning of surgery with A SIP OF WATER: Prilosec if needed, xanax if needed                                 You may not have any metal on your body including hair pins and              piercings  Do not wear jewelry, make-up, lotions, powders or perfumes, deodorant             Do not wear nail polish.  Do not shave  48 hours prior to surgery.                Do not bring valuables to the hospital. [place].  Contacts, dentures or bridgework may not be worn into surgery.  Leave suitcase in the car. After surgery it may be brought to your room.                       Please read over the following fact sheets you were given: _____________________________________________________________________             [facility] - Preparing for Surgery Before surgery, you can play an important role.  Because skin is not sterile, your skin needs to be as free of germs as possible.  You can reduce the number of germs on your skin by washing with CHG (chlorahexidine gluconate) soap before surgery.  CHG is an antiseptic cleaner which kills germs and bonds with the skin to continue killing germs even after washing. Please DO NOT use if you have an allergy to CHG or antibacterial soaps.  If your skin becomes reddened/irritated stop using the CHG and inform your nurse when you arrive at Short Stay. Do not shave (including legs and underarms) for at least 48 hours prior to the first CHG shower.  You may shave your  face/neck. Please follow these instructions carefully:  1.  Shower with CHG Soap the night before surgery and the  morning of Surgery.  2.  If you choose to wash your hair, wash your hair first as usual with your  normal  shampoo.  3.  After you shampoo, rinse your hair and body thoroughly to remove the  shampoo.                           4.  Use CHG as you would any other liquid soap.  You can apply chg directly  to the skin and wash                       Gently with a scrungie or clean washcloth.  5.  Apply the CHG  Soap to your body ONLY FROM THE NECK DOWN.   Do not use on face/ open                           Wound or open sores. Avoid contact with eyes, ears mouth and genitals (private parts).                       Wash face,  Genitals (private parts) with your normal soap.             6.  Wash thoroughly, paying special attention to the area where your surgery  will be performed.  7.  Thoroughly rinse your body with warm water from the neck down.  8.  DO NOT shower/wash with your normal soap after using and rinsing off  the CHG Soap.                9.  Pat yourself dry with a clean towel.            10.  Wear clean pajamas.            11.  Place clean sheets on your bed the night of your first shower and do not  sleep with pets. Day of Surgery : Do not apply any lotions/deodorants the morning of surgery.  Please wear clean clothes to the hospital/surgery center.  FAILURE TO FOLLOW THESE INSTRUCTIONS MAY RESULT IN THE CANCELLATION OF YOUR SURGERY PATIENT SIGNATURE_________________________________  NURSE SIGNATURE__________________________________  ________________________________________________________________________

## 2018-09-28 ENCOUNTER — Encounter (HOSPITAL_COMMUNITY): Payer: Self-pay

## 2018-09-28 ENCOUNTER — Encounter (HOSPITAL_COMMUNITY)
Admission: RE | Admit: 2018-09-28 | Discharge: 2018-09-28 | Disposition: A | Discharge status: Home / Self Care | Payer: Medicare Other | Source: Ambulatory Visit | Attending: Surgery | Admitting: Surgery

## 2018-09-28 ENCOUNTER — Other Ambulatory Visit: Payer: Self-pay

## 2018-09-28 DIAGNOSIS — Z01812 Encounter for preprocedural laboratory examination: Secondary | ICD-10-CM | POA: Insufficient documentation

## 2018-09-28 HISTORY — DX: Nonrheumatic aortic (valve) insufficiency: I35.1

## 2018-09-28 HISTORY — DX: Hyperlipidemia, unspecified: E78.5

## 2018-09-28 HISTORY — DX: Palpitations: R00.2

## 2018-09-28 LAB — CBC
HCT: 41 % (ref 36.0–46.0)
Hemoglobin: 13.5 g/dL (ref 12.0–15.0)
MCH: 27.8 pg (ref 26.0–34.0)
MCHC: 32.9 g/dL (ref 30.0–36.0)
MCV: 84.4 fL (ref 78.0–100.0)
Platelets: 230 10*3/uL (ref 150–400)
RBC: 4.86 MIL/uL (ref 3.87–5.11)
RDW: 13.2 % (ref 11.5–15.5)
WBC: 6.1 10*3/uL (ref 4.0–10.5)

## 2018-09-28 LAB — BASIC METABOLIC PANEL
Anion gap: 7 (ref 5–15)
BUN: 12 mg/dL (ref 8–23)
CHLORIDE: 107 mmol/L (ref 98–111)
CO2: 25 mmol/L (ref 22–32)
Calcium: 9 mg/dL (ref 8.9–10.3)
Creatinine, Ser: 0.75 mg/dL (ref 0.44–1.00)
GFR calc non Af Amer: >60 mL/min (ref >60)
Glucose, Bld: 89 mg/dL (ref 70–99)
Potassium: 4.2 mmol/L (ref 3.5–5.1)
Sodium: 139 mmol/L (ref 135–145)

## 2018-10-02 NOTE — H&P (Signed)
Carolan Clines Location: [facility] Patient #: [id number] DOB: Sep 23, 1951 Married / Language: English / Race: White Female   History of Present Illness  The patient is a 67 year old female who presents with a hiatal hernia. This 67 year old lady had reflux symptoms back in the 90s but got better but she has developed a large hiatal hernia. Recently she has been having more bilateral subcostal pain with eating and these are spasmodic and can be quite severe. I have reviewed her esophagram she seems to have about a third of her stomach up in her chest. There is also some esophageal retraction. Her gastroenterologist is Dr. Clarene Essex.  I have described laparoscopic hiatal hernia repair with Nissen fundoplication to her in detail using  drawings and I gave her a booklet on this as well. She is aware of the risks and benefits and is eager to proceed with this surgery. She had her gallbladder removed in '94 and make sure that's okay only get an ultrasound of her liver make sure shouldn't have any choledocholithiasis. We'll go ahead and schedule her for laparoscopic hiatal hernia and possible fundoplication   Past Surgical History  Cataract Surgery  Bilateral. Gallbladder Surgery - Laparoscopic  Hysterectomy (not due to cancer) - Complete  Tonsillectomy   Diagnostic Studies History  Colonoscopy  1-5 years ago Mammogram  within last year Pap Smear  >5 years ago  Allergies Sulfur  Itching. Allergies Reconciled   Medication History  FLUoxetine HCl (40MG  Capsule, Oral) Active. Losartan Potassium (50MG  Tablet, Oral) Active. Famotidine (20MG  Tablet, Oral) Active. ALPRAZolam (0.25MG  Tablet, Oral) Active. Omeprazole (40MG  Capsule DR, Oral) Active. Aspirin (81MG  Tablet DR, Oral) Active. Medications Reconciled  Social History Alcohol use  Occasional alcohol use. Caffeine use  Coffee. No drug use  Tobacco use  Never smoker.  Family History  Colon Cancer   Family Members In General. Depression  Brother, Sister. Heart Disease  Brother, Father. Hypertension  Mother. Melanoma  Mother. Migraine Headache  Father.  Pregnancy / Birth History Age at menarche  60 years. Age of menopause  <44 Contraceptive History  Oral contraceptives. Gravida  0 Para  0  Other Problems  Anxiety Disorder  Cholelithiasis  Gastroesophageal Reflux Disease  High blood pressure  Inguinal Hernia  Kidney Stone  Oophorectomy     Review of Systems  General Present- Fatigue and Weight Loss. Not Present- Appetite Loss, Chills, Fever, Night Sweats and Weight Gain. Skin Present- Non-Healing Wounds. Not Present- Change in Wart/Mole, Dryness, Hives, Jaundice, New Lesions, Rash and Ulcer. HEENT Not Present- Earache, Hearing Loss, Hoarseness, Nose Bleed, Oral Ulcers, Ringing in the Ears, Seasonal Allergies, Sinus Pain, Sore Throat, Visual Disturbances, Wears glasses/contact lenses and Yellow Eyes. Respiratory Not Present- Bloody sputum, Chronic Cough, Difficulty Breathing, Snoring and Wheezing. Breast Not Present- Breast Mass, Breast Pain, Nipple Discharge and Skin Changes. Cardiovascular Not Present- Chest Pain, Difficulty Breathing Lying Down, Leg Cramps, Palpitations, Rapid Heart Rate, Shortness of Breath and Swelling of Extremities. Gastrointestinal Not Present- Abdominal Pain, Bloating, Bloody Stool, Change in Bowel Habits, Chronic diarrhea, Constipation, Difficulty Swallowing, Excessive gas, Gets full quickly at meals, Hemorrhoids, Indigestion, Nausea, Rectal Pain and Vomiting. Female Genitourinary Not Present- Frequency, Nocturia, Painful Urination, Pelvic Pain and Urgency. Musculoskeletal Not Present- Back Pain, Joint Pain, Joint Stiffness, Muscle Pain, Muscle Weakness and Swelling of Extremities. Neurological Not Present- Decreased Memory, Fainting, Headaches, Numbness, Seizures, Tingling, Tremor, Trouble walking and Weakness. Psychiatric Present-  Anxiety and Fearful. Not Present- Bipolar, Change in Sleep Pattern, Depression and  Frequent crying. Endocrine Present- Hair Changes. Not Present- Cold Intolerance, Excessive Hunger, Heat Intolerance, Hot flashes and New Diabetes.  Vitals  Weight: 161.6 lb Height: 66.5in Body Surface Area: 1.84 m Body Mass Index: 25.69 kg/m  Pain Level: 0/10 Temp.: 98.79F(Temporal)  Pulse: 96 (Regular)  P.OX: 98% (Room air) BP: 140/82 (Sitting, Left Arm, Standard)       Physical Exam  General Note: WDWNWF NAD younger appearing -she has lost from 220 down to present 150s. HEENT glasses Neck supple Chest clear Heart SR -has bicuspid aortic valve Abdomen is nontender and prior lap chole Ext FROM     Assessment & Plan  HIATAL HERNIA (K44.9) Impression:Laparoscopic hiatal hernia repair and possible Nissen fundoplication.

## 2018-10-03 ENCOUNTER — Other Ambulatory Visit: Payer: Self-pay

## 2018-10-03 ENCOUNTER — Encounter (HOSPITAL_COMMUNITY): Payer: Self-pay | Admitting: *Deleted

## 2018-10-03 ENCOUNTER — Encounter (HOSPITAL_COMMUNITY)
Admission: RE | Disposition: A | Discharge status: Home / Self Care | Payer: Self-pay | Source: Other Acute Inpatient Hospital | Attending: Surgery

## 2018-10-03 ENCOUNTER — Inpatient Hospital Stay (HOSPITAL_COMMUNITY)
Admission: RE | Admit: 2018-10-03 | Discharge: 2018-10-05 | DRG: 327 | Disposition: A | Discharge status: Home / Self Care | Payer: Medicare Other | Source: Other Acute Inpatient Hospital | Attending: Surgery | Admitting: Surgery

## 2018-10-03 ENCOUNTER — Inpatient Hospital Stay (HOSPITAL_COMMUNITY): Payer: Medicare Other | Admitting: Certified Registered Nurse Anesthetist

## 2018-10-03 DIAGNOSIS — I10 Essential (primary) hypertension: Secondary | ICD-10-CM | POA: Diagnosis present

## 2018-10-03 DIAGNOSIS — Q231 Congenital insufficiency of aortic valve: Secondary | ICD-10-CM | POA: Diagnosis not present

## 2018-10-03 DIAGNOSIS — Z79899 Other long term (current) drug therapy: Secondary | ICD-10-CM | POA: Diagnosis not present

## 2018-10-03 DIAGNOSIS — K219 Gastro-esophageal reflux disease without esophagitis: Secondary | ICD-10-CM | POA: Diagnosis present

## 2018-10-03 DIAGNOSIS — E785 Hyperlipidemia, unspecified: Secondary | ICD-10-CM | POA: Diagnosis present

## 2018-10-03 DIAGNOSIS — Z8719 Personal history of other diseases of the digestive system: Secondary | ICD-10-CM

## 2018-10-03 DIAGNOSIS — F419 Anxiety disorder, unspecified: Secondary | ICD-10-CM | POA: Diagnosis present

## 2018-10-03 DIAGNOSIS — K449 Diaphragmatic hernia without obstruction or gangrene: Principal | ICD-10-CM | POA: Diagnosis present

## 2018-10-03 DIAGNOSIS — R1310 Dysphagia, unspecified: Secondary | ICD-10-CM | POA: Diagnosis present

## 2018-10-03 DIAGNOSIS — Z9889 Other specified postprocedural states: Secondary | ICD-10-CM

## 2018-10-03 HISTORY — DX: Family history of other specified conditions: Z84.89

## 2018-10-03 HISTORY — PX: HIATAL HERNIA REPAIR: SHX195

## 2018-10-03 LAB — CBC
HCT: 39.8 % (ref 36.0–46.0)
Hemoglobin: 13 g/dL (ref 12.0–15.0)
MCH: 28 pg (ref 26.0–34.0)
MCHC: 32.7 g/dL (ref 30.0–36.0)
MCV: 85.8 fL (ref 78.0–100.0)
PLATELETS: 191 10*3/uL (ref 150–400)
RBC: 4.64 MIL/uL (ref 3.87–5.11)
RDW: 13.4 % (ref 11.5–15.5)
WBC: 11.6 10*3/uL — ABNORMAL HIGH (ref 4.0–10.5)

## 2018-10-03 LAB — CREATININE, SERUM
Creatinine, Ser: 0.71 mg/dL (ref 0.44–1.00)
GFR calc Af Amer: >60 mL/min (ref >60)
GFR calc non Af Amer: >60 mL/min (ref >60)

## 2018-10-03 SURGERY — REPAIR, HERNIA, HIATAL, LAPAROSCOPIC
Anesthesia: General

## 2018-10-03 MED ORDER — PROPOFOL 10 MG/ML IV BOLUS
INTRAVENOUS | Status: DC | PRN
  Administered 2018-10-03: 50 mg via INTRAVENOUS
  Administered 2018-10-03: 100 mg via INTRAVENOUS

## 2018-10-03 MED ORDER — CEFAZOLIN SODIUM-DEXTROSE 2-4 GM/100ML-% IV SOLN
2.0000 g | Freq: Three times a day (TID) | INTRAVENOUS | Status: AC
  Administered 2018-10-03: 2 g via INTRAVENOUS
  Filled 2018-10-03: qty 100

## 2018-10-03 MED ORDER — LIDOCAINE 2% (20 MG/ML) 5 ML SYRINGE
INTRAMUSCULAR | Status: DC | PRN
  Administered 2018-10-03: 40 mg via INTRAVENOUS

## 2018-10-03 MED ORDER — CHLORHEXIDINE GLUCONATE CLOTH 2 % EX PADS: 6.0000 | MEDICATED_PAD | Freq: Once | CUTANEOUS | Status: DC

## 2018-10-03 MED ORDER — ONDANSETRON HCL 4 MG/2ML IJ SOLN
INTRAMUSCULAR | Status: DC | PRN
  Administered 2018-10-03: 4 mg via INTRAVENOUS

## 2018-10-03 MED ORDER — SODIUM CHLORIDE 0.9 % IJ SOLN
INTRAMUSCULAR | Status: DC | PRN
  Administered 2018-10-03: 10 mL

## 2018-10-03 MED ORDER — MIDAZOLAM HCL 5 MG/5ML IJ SOLN
INTRAMUSCULAR | Status: DC | PRN
  Administered 2018-10-03: 2 mg via INTRAVENOUS

## 2018-10-03 MED ORDER — MIDAZOLAM HCL 2 MG/2ML IJ SOLN
INTRAMUSCULAR | Status: AC
  Filled 2018-10-03: qty 2

## 2018-10-03 MED ORDER — ONDANSETRON HCL 4 MG/2ML IJ SOLN
INTRAMUSCULAR | Status: AC
  Filled 2018-10-03: qty 2

## 2018-10-03 MED ORDER — FAMOTIDINE IN NACL 20-0.9 MG/50ML-% IV SOLN
20.0000 mg | Freq: Two times a day (BID) | INTRAVENOUS | Status: DC
  Administered 2018-10-03 – 2018-10-04 (×4): 20 mg via INTRAVENOUS
  Filled 2018-10-03 (×3): qty 50

## 2018-10-03 MED ORDER — ONDANSETRON 4 MG PO TBDP: 4.0000 mg | ORAL_TABLET | Freq: Four times a day (QID) | ORAL | Status: DC | PRN

## 2018-10-03 MED ORDER — FENTANYL CITRATE (PF) 100 MCG/2ML IJ SOLN
25.0000 ug | INTRAMUSCULAR | Status: DC | PRN
  Administered 2018-10-03 (×2): 50 ug via INTRAVENOUS

## 2018-10-03 MED ORDER — FENTANYL CITRATE (PF) 250 MCG/5ML IJ SOLN
INTRAMUSCULAR | Status: AC
  Filled 2018-10-03: qty 5

## 2018-10-03 MED ORDER — 0.9 % SODIUM CHLORIDE (POUR BTL) OPTIME
TOPICAL | Status: DC | PRN
  Administered 2018-10-03: 1000 mL

## 2018-10-03 MED ORDER — HEPARIN SODIUM (PORCINE) 5000 UNIT/ML IJ SOLN
5000.0000 [IU] | Freq: Three times a day (TID) | INTRAMUSCULAR | Status: DC
  Administered 2018-10-03 – 2018-10-05 (×5): 5000 [IU] via SUBCUTANEOUS
  Filled 2018-10-03 (×5): qty 1

## 2018-10-03 MED ORDER — METOPROLOL TARTRATE 5 MG/5ML IV SOLN: 5.0000 mg | Freq: Four times a day (QID) | INTRAVENOUS | Status: DC | PRN

## 2018-10-03 MED ORDER — OXYCODONE HCL 5 MG PO TABS: 5.0000 mg | ORAL_TABLET | Freq: Once | ORAL | Status: DC | PRN

## 2018-10-03 MED ORDER — LACTATED RINGERS IR SOLN
Status: DC | PRN
  Administered 2018-10-03: 1000 mL

## 2018-10-03 MED ORDER — DEXAMETHASONE SODIUM PHOSPHATE 10 MG/ML IJ SOLN
INTRAMUSCULAR | Status: DC | PRN
  Administered 2018-10-03: 5 mg via INTRAVENOUS

## 2018-10-03 MED ORDER — CEFAZOLIN SODIUM-DEXTROSE 2-4 GM/100ML-% IV SOLN
2.0000 g | INTRAVENOUS | Status: AC
  Administered 2018-10-03: 2 g via INTRAVENOUS
  Filled 2018-10-03: qty 100

## 2018-10-03 MED ORDER — LIDOCAINE HCL (CARDIAC) PF 100 MG/5ML IV SOSY
PREFILLED_SYRINGE | INTRAVENOUS | Status: AC
  Filled 2018-10-03: qty 5

## 2018-10-03 MED ORDER — GABAPENTIN 300 MG PO CAPS
300.0000 mg | ORAL_CAPSULE | Freq: Two times a day (BID) | ORAL | Status: DC
  Administered 2018-10-03 – 2018-10-04 (×4): 300 mg via ORAL
  Filled 2018-10-03 (×5): qty 1

## 2018-10-03 MED ORDER — ONDANSETRON HCL 4 MG/2ML IJ SOLN
4.0000 mg | Freq: Four times a day (QID) | INTRAMUSCULAR | Status: DC | PRN
  Administered 2018-10-04: 4 mg via INTRAVENOUS
  Filled 2018-10-03: qty 2

## 2018-10-03 MED ORDER — PROPOFOL 10 MG/ML IV BOLUS
INTRAVENOUS | Status: AC
  Filled 2018-10-03: qty 20

## 2018-10-03 MED ORDER — EPHEDRINE SULFATE-NACL 50-0.9 MG/10ML-% IV SOSY
PREFILLED_SYRINGE | INTRAVENOUS | Status: DC | PRN
  Administered 2018-10-03 (×2): 15 mg via INTRAVENOUS
  Administered 2018-10-03: 10 mg via INTRAVENOUS

## 2018-10-03 MED ORDER — GABAPENTIN 300 MG PO CAPS
300.0000 mg | ORAL_CAPSULE | ORAL | Status: AC
  Administered 2018-10-03: 300 mg via ORAL
  Filled 2018-10-03: qty 1

## 2018-10-03 MED ORDER — ROCURONIUM BROMIDE 10 MG/ML (PF) SYRINGE
PREFILLED_SYRINGE | INTRAVENOUS | Status: DC | PRN
  Administered 2018-10-03: 20 mg via INTRAVENOUS
  Administered 2018-10-03: 40 mg via INTRAVENOUS
  Administered 2018-10-03: 20 mg via INTRAVENOUS

## 2018-10-03 MED ORDER — KETAMINE HCL 10 MG/ML IJ SOLN
INTRAMUSCULAR | Status: DC | PRN
  Administered 2018-10-03: 20 mg via INTRAVENOUS

## 2018-10-03 MED ORDER — SUCCINYLCHOLINE CHLORIDE 20 MG/ML IJ SOLN
INTRAMUSCULAR | Status: DC | PRN
  Administered 2018-10-03: 80 mg via INTRAVENOUS

## 2018-10-03 MED ORDER — PROCHLORPERAZINE EDISYLATE 10 MG/2ML IJ SOLN: 5.0000 mg | Freq: Four times a day (QID) | INTRAMUSCULAR | Status: DC | PRN

## 2018-10-03 MED ORDER — HYDROMORPHONE HCL 1 MG/ML IJ SOLN
0.5000 mg | INTRAMUSCULAR | Status: DC | PRN
  Administered 2018-10-03 – 2018-10-04 (×2): 0.5 mg via INTRAVENOUS
  Filled 2018-10-03 (×2): qty 0.5

## 2018-10-03 MED ORDER — LIDOCAINE 2% (20 MG/ML) 5 ML SYRINGE
INTRAMUSCULAR | Status: DC | PRN
  Administered 2018-10-03: 3 mL/h via INTRAVENOUS

## 2018-10-03 MED ORDER — ONDANSETRON HCL 4 MG/2ML IJ SOLN: 4.0000 mg | Freq: Once | INTRAMUSCULAR | Status: DC | PRN

## 2018-10-03 MED ORDER — BUPIVACAINE LIPOSOME 1.3 % IJ SUSP
INTRAMUSCULAR | Status: DC | PRN
  Administered 2018-10-03: 20 mL

## 2018-10-03 MED ORDER — SODIUM CHLORIDE 0.9 % IJ SOLN
INTRAMUSCULAR | Status: AC
  Filled 2018-10-03: qty 10

## 2018-10-03 MED ORDER — SUGAMMADEX SODIUM 200 MG/2ML IV SOLN
INTRAVENOUS | Status: AC
  Filled 2018-10-03: qty 2

## 2018-10-03 MED ORDER — FENTANYL CITRATE (PF) 100 MCG/2ML IJ SOLN
INTRAMUSCULAR | Status: AC
  Administered 2018-10-03: 50 ug via INTRAVENOUS
  Filled 2018-10-03: qty 2

## 2018-10-03 MED ORDER — HEPARIN SODIUM (PORCINE) 5000 UNIT/ML IJ SOLN
5000.0000 [IU] | Freq: Once | INTRAMUSCULAR | Status: AC
  Administered 2018-10-03: 5000 [IU] via SUBCUTANEOUS
  Filled 2018-10-03: qty 1

## 2018-10-03 MED ORDER — ACETAMINOPHEN 500 MG PO TABS
1000.0000 mg | ORAL_TABLET | ORAL | Status: AC
  Administered 2018-10-03: 1000 mg via ORAL
  Filled 2018-10-03: qty 2

## 2018-10-03 MED ORDER — OXYCODONE HCL 5 MG/5ML PO SOLN
5.0000 mg | Freq: Once | ORAL | Status: DC | PRN
  Filled 2018-10-03: qty 5

## 2018-10-03 MED ORDER — KCL IN DEXTROSE-NACL 20-5-0.45 MEQ/L-%-% IV SOLN
INTRAVENOUS | Status: DC
  Administered 2018-10-03 – 2018-10-04 (×2): via INTRAVENOUS
  Filled 2018-10-03 (×4): qty 1000

## 2018-10-03 MED ORDER — LACTATED RINGERS IV SOLN
INTRAVENOUS | Status: DC
  Administered 2018-10-03 (×2): via INTRAVENOUS

## 2018-10-03 MED ORDER — SUGAMMADEX SODIUM 200 MG/2ML IV SOLN
INTRAVENOUS | Status: DC | PRN
  Administered 2018-10-03: 150 mg via INTRAVENOUS

## 2018-10-03 MED ORDER — BUPIVACAINE LIPOSOME 1.3 % IJ SUSP
20.0000 mL | Freq: Once | INTRAMUSCULAR | Status: DC
  Filled 2018-10-03: qty 20

## 2018-10-03 MED ORDER — HYDRALAZINE HCL 20 MG/ML IJ SOLN: 10.0000 mg | INTRAMUSCULAR | Status: DC | PRN

## 2018-10-03 MED ORDER — FENTANYL CITRATE (PF) 100 MCG/2ML IJ SOLN
INTRAMUSCULAR | Status: DC | PRN
  Administered 2018-10-03 (×3): 50 ug via INTRAVENOUS
  Administered 2018-10-03: 100 ug via INTRAVENOUS

## 2018-10-03 MED ORDER — PROCHLORPERAZINE MALEATE 10 MG PO TABS: 10.0000 mg | ORAL_TABLET | Freq: Four times a day (QID) | ORAL | Status: DC | PRN

## 2018-10-03 SURGICAL SUPPLY — 67 items
ADH SKN CLS APL DERMABOND .7 (GAUZE/BANDAGES/DRESSINGS) ×1
APPLIER CLIP ROT 10 11.4 M/L (STAPLE) ×3
APPLIER CLIP ROT 13.4 12 LRG (CLIP) ×3
APR CLP LRG 13.4X12 ROT 20 MLT (CLIP) ×1
APR CLP MED LRG 11.4X10 (STAPLE) ×1
BLADE 11 SAFETY STRL DISP (BLADE) ×2 IMPLANT
CABLE HIGH FREQUENCY MONO STRZ (ELECTRODE) IMPLANT
CLIP APPLIE ROT 10 11.4 M/L (STAPLE) IMPLANT
CLIP APPLIE ROT 13.4 12 LRG (CLIP) IMPLANT
COVER SURGICAL LIGHT HANDLE (MISCELLANEOUS) ×3 IMPLANT
DECANTER SPIKE VIAL GLASS SM (MISCELLANEOUS) ×3 IMPLANT
DERMABOND ADVANCED (GAUZE/BANDAGES/DRESSINGS) ×2
DERMABOND ADVANCED .7 DNX12 (GAUZE/BANDAGES/DRESSINGS) IMPLANT
DEVICE SUT QUICK LOAD TK 5 (STAPLE) ×10 IMPLANT
DEVICE SUT TI-KNOT TK 5X26 (MISCELLANEOUS) ×2 IMPLANT
DEVICE SUTURE ENDOST 10MM (ENDOMECHANICALS) ×3 IMPLANT
DEVICE TI KNOT TK5 (MISCELLANEOUS) ×1
DISSECTOR BLUNT TIP ENDO 5MM (MISCELLANEOUS) ×3 IMPLANT
DRAIN PENROSE 18X1/2 LTX STRL (DRAIN) ×3 IMPLANT
DRAPE LAPAROSCOPIC ABDOMINAL (DRAPES) ×3 IMPLANT
ELECT L-HOOK LAP 45CM DISP (ELECTROSURGICAL) ×3
ELECT PENCIL ROCKER SW 15FT (MISCELLANEOUS) ×3 IMPLANT
ELECT REM PT RETURN 15FT ADLT (MISCELLANEOUS) ×3 IMPLANT
ELECTRODE L-HOOK LAP 45CM DISP (ELECTROSURGICAL) ×1 IMPLANT
GLOVE BIO SURGEON STRL SZ 6.5 (GLOVE) ×1 IMPLANT
GLOVE BIO SURGEONS STRL SZ 6.5 (GLOVE) ×1
GLOVE BIOGEL M 8.0 STRL (GLOVE) ×3 IMPLANT
GLOVE BIOGEL PI IND STRL 6.5 (GLOVE) IMPLANT
GLOVE BIOGEL PI IND STRL 7.0 (GLOVE) IMPLANT
GLOVE BIOGEL PI INDICATOR 6.5 (GLOVE) ×8
GLOVE BIOGEL PI INDICATOR 7.0 (GLOVE) ×8
GLOVE SURG SS PI 7.0 STRL IVOR (GLOVE) ×12 IMPLANT
GOWN STRL REUS W/ TWL LRG LVL3 (GOWN DISPOSABLE) IMPLANT
GOWN STRL REUS W/TWL LRG LVL3 (GOWN DISPOSABLE) ×18
GOWN STRL REUS W/TWL XL LVL3 (GOWN DISPOSABLE) ×9 IMPLANT
GRASPER SUT TROCAR 14GX15 (MISCELLANEOUS) ×2 IMPLANT
KIT BASIN OR (CUSTOM PROCEDURE TRAY) ×3 IMPLANT
KIT DEFENDO VALVE AND CONN (KITS) ×2 IMPLANT
KIT PRC NS LF DISP ENDO (KITS) IMPLANT
KIT PROCEDURE OLYMPUS (KITS) ×3
PAD POSITIONING PINK XL (MISCELLANEOUS) ×2 IMPLANT
POSITIONER SURGICAL ARM (MISCELLANEOUS) IMPLANT
QUICK LOAD TK 5 (STAPLE) ×5
RELOAD ENDO STITCH (ENDOMECHANICALS) ×9 IMPLANT
RELOAD SUT TRIPLE-STITCH 2-0 (ENDOMECHANICALS) ×3 IMPLANT
SCISSORS LAP 5X45 EPIX DISP (ENDOMECHANICALS) ×3 IMPLANT
SET IRRIG TUBING LAPAROSCOPIC (IRRIGATION / IRRIGATOR) ×3 IMPLANT
SHEARS HARMONIC ACE PLUS 45CM (MISCELLANEOUS) ×3 IMPLANT
SLEEVE ADV FIXATION 5X100MM (TROCAR) ×6 IMPLANT
STAPLER VISISTAT 35W (STAPLE) ×3 IMPLANT
SUT MNCRL AB 4-0 PS2 18 (SUTURE) ×4 IMPLANT
SUT SURGIDAC NAB ES-9 0 48 120 (SUTURE) ×17 IMPLANT
SUT VIC AB 4-0 SH 18 (SUTURE) IMPLANT
TIP INNERVISION DETACH 40FR (MISCELLANEOUS) IMPLANT
TIP INNERVISION DETACH 50FR (MISCELLANEOUS) IMPLANT
TIP INNERVISION DETACH 56FR (MISCELLANEOUS) IMPLANT
TIPS INNERVISION DETACH 40FR (MISCELLANEOUS)
TOWEL OR 17X26 10 PK STRL BLUE (TOWEL DISPOSABLE) ×6 IMPLANT
TOWEL OR NON WOVEN STRL DISP B (DISPOSABLE) ×3 IMPLANT
TRAY FOLEY W/BAG SLVR 14FR (SET/KITS/TRAYS/PACK) ×2 IMPLANT
TRAY LAPAROSCOPIC (CUSTOM PROCEDURE TRAY) ×3 IMPLANT
TROCAR ADV FIXATION 11X100MM (TROCAR) ×3 IMPLANT
TROCAR ADV FIXATION 5X100MM (TROCAR) ×3 IMPLANT
TROCAR BLADELESS OPT 5 100 (ENDOMECHANICALS) ×3 IMPLANT
TUBING CONNECTING 10 (TUBING) ×1 IMPLANT
TUBING CONNECTING 10' (TUBING) ×1
TUBING INSUF HEATED (TUBING) ×3 IMPLANT

## 2018-10-03 NOTE — Anesthesia Postprocedure Evaluation (Signed)
Anesthesia Post Note  Patient: Judy Mitchell  Procedure(s) Performed: LAPAROSCOPIC REPAIR OF HIATAL HERNIA WITH GASTROPEXY, UPPER ENDOSCOPY (N/A )     Patient location during evaluation: PACU Anesthesia Type: General Level of consciousness: awake and alert Pain management: pain level controlled Vital Signs Assessment: post-procedure vital signs reviewed and stable Respiratory status: spontaneous breathing, nonlabored ventilation, respiratory function stable and patient connected to nasal cannula oxygen Cardiovascular status: blood pressure returned to baseline and stable Postop Assessment: no apparent nausea or vomiting Anesthetic complications: no    Last Vitals:  Vitals:   10/03/18 1330 10/03/18 1400  BP: 132/65 (!) 132/58  Pulse: 87 82  Resp: (!) 25 (!) 21  Temp: 36.4 C 36.6 C  SpO2: 95% 98%    Last Pain:  Vitals:   10/03/18 1400  TempSrc: Oral  PainSc: 5                  Sadae Arrazola COKER

## 2018-10-03 NOTE — Anesthesia Procedure Notes (Signed)
Procedure Name: Intubation Performed by: Jenae Tomasello J, CRNA Pre-anesthesia Checklist: Patient identified, Emergency Drugs available, Suction available, Patient being monitored and Timeout performed Patient Re-evaluated:Patient Re-evaluated prior to induction Oxygen Delivery Method: Circle system utilized Preoxygenation: Pre-oxygenation with 100% oxygen Induction Type: IV induction Ventilation: Mask ventilation without difficulty Laryngoscope Size: Mac and 3 Grade View: Grade I Tube type: Oral Tube size: 7.0 mm Number of attempts: 1 Airway Equipment and Method: Stylet Placement Confirmation: ETT inserted through vocal cords under direct vision,  positive ETCO2 and breath sounds checked- equal and bilateral Secured at: 21 cm Tube secured with: Tape Dental Injury: Teeth and Oropharynx as per pre-operative assessment        

## 2018-10-03 NOTE — Op Note (Signed)
Preoperative diagnosis: laparoscopic repair of paraesophageal hernia  Postoperative diagnosis: Same   Procedure: Upper endoscopy   Surgeon: Clovis Riley, M.D.  Anesthesia: Gen.   Description of procedure: The patient was undergoing laparoscopic paraesophageal hernia repair and we had reached the maximum amount of esophageal mobilization within the mediastinum. Endoscopy was placed in the mouth and into the oropharynx and under endoscopic vision it was advanced to the esophagogastric junction.  The pouch was insufflated and no bleeding or bubbles were seen.  The GEJ was identified and noted to still be several centimeters above the crura. The esophagus had no evidence of injury. The scope was withdrawn without difficulty.    Clovis Riley, M.D. General, Bariatric, & Minimally Invasive Surgery [facility]

## 2018-10-03 NOTE — Transfer of Care (Signed)
Immediate Anesthesia Transfer of Care Note  Patient: Judy Mitchell  Procedure(s) Performed: LAPAROSCOPIC REPAIR OF HIATAL HERNIA WITH GASTROPEXY, UPPER ENDOSCOPY (N/A )  Patient Location: PACU  Anesthesia Type:General  Level of Consciousness: sedated, patient cooperative and responds to stimulation  Airway & Oxygen Therapy: Patient Spontanous Breathing and Patient connected to face mask oxygen  Post-op Assessment: Report given to RN and Post -op Vital signs reviewed and stable  Post vital signs: Reviewed and stable  Last Vitals:  Vitals Value Taken Time  BP 168/70 10/03/2018 12:35 PM  Temp    Pulse 90 10/03/2018 12:37 PM  Resp 29 10/03/2018 12:37 PM  SpO2 91 % 10/03/2018 12:37 PM  Vitals shown include unvalidated device data.  Last Pain:  Vitals:   10/03/18 1235  TempSrc:   PainSc: (P) Asleep         Complications: No apparent anesthesia complications

## 2018-10-03 NOTE — Interval H&P Note (Signed)
History and Physical Interval Note:  10/03/2018 9:18 AM  Judy Mitchell  has presented today for surgery, with the diagnosis of LARGE HIATAL HERNIA  The various methods of treatment have been discussed with the patient and family. After consideration of risks, benefits and other options for treatment, the patient has consented to  Procedure(s): [place] (N/A) as a surgical intervention .  The patient's history has been reviewed, patient examined, no change in status, stable for surgery.  I have reviewed the patient's chart and labs.  Questions were answered to the patient's satisfaction.     Pedro Earls

## 2018-10-03 NOTE — Op Note (Signed)
Judy Mitchell  [birth year] 03 October 2018    PCP:  Rosalee Kaufman, PA-C   Surgeon: Kaylyn Lim, MD, FACS  Asst:  Romana Juniper, MD, FACS  Anes:  general  Preop Dx: Large type III hiatal hernia Postop Dx: same  Procedure: Laparoscopic takedown of large type III hiatal hernia; endoscopy (foreshortened esophagus) with 5 suture posterior hiatal closure and two suture gastropexy (no Nissen) Location Surgery: [facility] 1 Complications: none  EBL:   minimal cc  Drains: none  Description of Procedure:  The patient was taken to OR 1 .  After anesthesia was administered and the patient was prepped  with Technicare and a timeout was performed.  Access was achieved with a 5 mm Optiview through the left upper quadrant.  Following insufflation a second 5 mm placed to the left the umbilicus and a 12 placed to the right of the umbilicus and a 5 placed in the right upper quadrant and the 5 mm in the upper midline for the [facility] retractor.  With liver retraction there was a large hiatal defect with about a third of her stomach in her chest.  This was mobilized with a approach from the right side and from the left side stripping way most of the sac and eventually centimeters posteriorly where we placed a Penrose about the stomach and retracted downward.  With that in place we freed the lateral attachments of the distal esophagus preserving the posterior and anterior vagi.  Excess sac was removed.  Endoscopy was performed by Dr. Windle Guard revealing that [name] was in fact at least 3 cm above the hiatus at that point.  We worked longer to get up higher without generating any more length.Ryann Pauli Majestic ahead at that point and closed the hiatal structures.  Posterior closure was performed with 5 sutures using Surgidek and secured with tie knots.  This brought the hiatal size one way down but it was not restricting esophagus.  Endoscopy was performed again demonstrating that the GE junction was still above  the diaphragmatic closure and hence a Nissen fundoplication should not be performed.  Gastropexy was performed at the site of the left upper quadrant 5 mm trocar placing 2 sutures of 0 Surgidek through the greater curvature anteriorly and pulling those up with L PMI device and tying both down at the fascial level.  The wounds were all closed with 4-0 Monocryl and Dermabond.  The patient tolerated the procedure well and was taken to the PACU in stable condition.     Matt B. Hassell Done, [place], [facility], [place]

## 2018-10-03 NOTE — Anesthesia Preprocedure Evaluation (Signed)

## 2018-10-04 ENCOUNTER — Encounter (HOSPITAL_COMMUNITY): Payer: Self-pay | Admitting: Surgery

## 2018-10-04 MED ORDER — ACETAMINOPHEN 325 MG PO TABS
650.0000 mg | ORAL_TABLET | ORAL | Status: DC | PRN
  Administered 2018-10-04 – 2018-10-05 (×2): 650 mg via ORAL
  Filled 2018-10-04 (×3): qty 2

## 2018-10-04 MED ORDER — ENSURE ENLIVE PO LIQD
237.0000 mL | Freq: Two times a day (BID) | ORAL | Status: DC
  Administered 2018-10-04 – 2018-10-05 (×2): 237 mL via ORAL

## 2018-10-04 NOTE — Progress Notes (Signed)
Initial Nutrition Assessment  DOCUMENTATION CODES:   Not applicable  INTERVENTION:    Ensure Enlive po BID (chocolate), each supplement provides 350 kcal and 20 grams of protein  RD provided diet education  NUTRITION DIAGNOSIS:   Increased nutrient needs related to post-op healing as evidenced by estimated needs.  GOAL:   Patient will meet greater than or equal to 90% of their needs  MONITOR:   Supplement acceptance, Labs, Weight trends, I & O's, PO intake  REASON FOR ASSESSMENT:   Malnutrition Screening Tool    ASSESSMENT:   Patient with PMH significant for HTN, GERD, HLD, and aortic insufficiency. Presents this admission with bilateral subcostal pain upon eating related to a large hiatal hernia.    10/7- Laparoscopic takedown of large type III hiatal hernia; endoscopy with 5 suture posterior hiatal closure and gastropexy (no Nissen)  Pt endorses having inconsistent PO intake over the last year. States some days she would eat 5-6 small meals, some days the pain from her hernia would become intolerable and she could only consume a couple of bites. No specific food items made pain worse or better. She did not consume protein supplements prior to surgery. Pt endorses a UBW of 167 lb and a wt loss of 11 lb over the last year. Records indicate pt weighed 173 lb a year ago and 160 lb this admission (7.5% wt loss in one year, insignificant for time frame).   Pt is to be on a full liquid diet for the next week and pureed for 4-6 weeks.  Reviewed clear and full liquids. Encouraged pt to avoid caffeine, carbonated beverages [place] of straws.  Provided examples of appropriate foods on a pureed diet. Reviewed gas producing foods. Discouraged intake of processed foods and red meat. Recommended use of a liquid multivitamin while following a liquid diet. Reviewed acceptable protein supplements.  Pt tolerating broth and water this morning. She is amendable to trying Ensure, RD to order.    Medications reviewed and include: D5 with 20 mEq KCl @ 50 ml/hr Labs reviewed.   NUTRITION - FOCUSED PHYSICAL EXAM:    Most Recent Value  Orbital Region  No depletion  Upper Arm Region  No depletion  Thoracic and Lumbar Region  Unable to assess  Buccal Region  No depletion  Temple Region  No depletion  Clavicle Bone Region  No depletion  Clavicle and Acromion Bone Region  No depletion  Scapular Bone Region  Unable to assess  Dorsal Hand  No depletion  Patellar Region  Mild depletion  Anterior Thigh Region  Mild depletion  Posterior Calf Region  Mild depletion  Edema (RD Assessment)  None     Diet Order:   Diet Order            Diet full liquid Room service appropriate? Yes; Fluid consistency: Thin  Diet effective now              EDUCATION NEEDS:   Education needs have been addressed  Skin:  Skin Assessment: Skin Integrity Issues: Skin Integrity Issues:: Incisions Incisions: closed abdomen  Last BM:  10/02/18  Height:   Ht Readings from Last 1 Encounters:  10/03/18 5' 6.5" (1.689 m)    Weight:   Wt Readings from Last 1 Encounters:  10/03/18 72.6 kg    Ideal Body Weight:  61.6 kg  BMI:  Body mass index is 25.44 kg/m.  Estimated Nutritional Needs:   Kcal:  1650-1850 kcal  Protein:  80-95 grams  Fluid:  >/=  1.6 L/day   Mariana Single RD, LDN Clinical Nutrition Pager # [phone]

## 2018-10-04 NOTE — Progress Notes (Signed)
Patient ID: Judy Mitchell, female   DOB: 1951/08/01, 67 y.o.   MRN: [id number] [facility] Progress Note:   1 Day Post-Op  Subjective: Mental status is clear Objective: Vital signs in last 24 hours: Temp:  [97.5 F (36.4 C)-98.9 F (37.2 C)] 98.3 F (36.8 C) (10/08 0654) Pulse Rate:  [69-92] 69 (10/08 0654) Resp:  [18-29] 18 (10/08 0654) BP: (132-168)/(52-77) 135/57 (10/08 0654) SpO2:  [91 %-99 %] 92 % (10/08 0654) Weight:  [72.6 kg] 72.6 kg (10/07 1400)  Intake/Output from previous day: 10/07 0701 - 10/08 0700 In: 3010.2 [P.O.:1060; I.V.:1951.2] Out: 2000 [Urine:1975; Blood:25] Intake/Output this shift: Total I/O In: -  Out: 200 [Urine:200]  Physical Exam: Work of breathing is normal.  Minimal problems  Lab Results:  Results for orders placed or performed during the hospital encounter of 10/03/18 (from the past 48 hour(s))  CBC     Status: Abnormal   Collection Time: 10/03/18  3:08 PM  Result Value Ref Range   WBC 11.6 (H) 4.0 - 10.5 K/uL   RBC 4.64 3.87 - 5.11 MIL/uL   Hemoglobin 13.0 12.0 - 15.0 g/dL   HCT 39.8 36.0 - 46.0 %   MCV 85.8 78.0 - 100.0 fL   MCH 28.0 26.0 - 34.0 pg   MCHC 32.7 30.0 - 36.0 g/dL   RDW 13.4 11.5 - 15.5 %   Platelets 191 150 - 400 K/uL    Comment: Performed at [facility], [place] [address], [place], [place] [address]  Creatinine, serum     Status: None   Collection Time: 10/03/18  3:08 PM  Result Value Ref Range   Creatinine, Ser 0.71 0.44 - 1.00 mg/dL   GFR calc non Af Amer >60 >60 mL/min   GFR calc Af Amer >60 >60 mL/min    Comment: (NOTE) The eGFR has been calculated using the CKD EPI equation. This calculation has not been validated in all clinical situations. eGFR's persistently <60 mL/min signify possible Chronic Kidney Disease. Performed at [facility], [place] [address], [place], [place] [address]     Radiology/Results: No results found.  Anti-infectives: Anti-infectives  (From admission, onward)   Start     Dose/Rate Route Frequency Ordered Stop   10/03/18 1800  ceFAZolin (ANCEF) IVPB 2g/100 mL premix     2 g 200 mL/hr over 30 Minutes Intravenous Every 8 hours 10/03/18 1416 10/03/18 1841   10/03/18 0745  ceFAZolin (ANCEF) IVPB 2g/100 mL premix     2 g 200 mL/hr over 30 Minutes Intravenous On call to O.R. 10/03/18 0735 10/03/18 1050      Assessment/Plan: Problem List: Patient Active Problem List   Diagnosis Date Noted  . History of repair of hiatal hernia Oct 2019 10/03/2018  . Hyperlipidemia 08/16/2018  . Aortic insufficiency 08/16/2018  . Palpitations 06/17/2018  . GERD (gastroesophageal reflux disease) 06/17/2018  . Hiatal hernia 04/13/2017  . Bloating 04/13/2017  . Essential hypertension 02/23/2017    Will advance to full liquids 1 Day Post-Op    LOS: 1 day   Matt B. Hassell Done, MD, [facility] [phone] beeper [phone]  10/04/2018 9:58 AM

## 2018-10-05 MED ORDER — ONDANSETRON 4 MG PO TBDP: 4.0000 mg | ORAL_TABLET | Freq: Four times a day (QID) | ORAL | 0 refills | Status: DC | PRN

## 2018-10-05 NOTE — Progress Notes (Signed)
Discharge instructions discussed with patient, verbalized agreement and understanding, 

## 2018-10-05 NOTE — Discharge Summary (Signed)
Physician Discharge Summary  Patient ID: Judy Mitchell MRN: [id number] DOB/AGE: Dec 22, 1951 67 y.o.  PCP: Rosalee Kaufman, PA-C  Admit date: 10/03/2018 Discharge date: 10/05/2018  Admission Diagnoses:  Large type III hiatal hernia with dysphagia  Discharge Diagnoses:  same  Principal Problem:   History of repair of hiatal hernia Oct 2019   Surgery:  Lap repair of hiatal hernia with endoscopy showing foreshortened esophagus;  gastropexy  Discharged Condition: improved  Hospital Course:   Had surgery.  Begun on clears and advanced to full liquids.  Walking about and ready for discharge.    Consults: none  Significant Diagnostic Studies: none    Discharge Exam: Blood pressure (!) 151/69, pulse 67, temperature 98.2 F (36.8 C), temperature source Oral, resp. rate 15, height 5' 6.5" (1.689 m), weight 72.6 kg, SpO2 97 %. Incisions OK.  Up walking without problems.  Some pulling at gastropexy site.  Patient declined oral narcotic pain meds.   Disposition: Discharge disposition: 01-Home or Self Care       Discharge Instructions    Diet - low sodium heart healthy   Complete by:  As directed    Discharge instructions   Complete by:  As directed    Pureed consistency food for ~ 2 weeks. Stay of PPI to control heartburn   Increase activity slowly   Complete by:  As directed      Allergies as of 10/05/2018      Reactions   Sulfa Antibiotics Itching, Rash      Medication List    STOP taking these medications   famotidine 20 MG tablet Commonly known as:  PEPCID     TAKE these medications   ALPRAZolam 0.25 MG tablet Commonly known as:  XANAX Take 0.125 mg by mouth 2 (two) times daily as needed for anxiety.   aspirin EC 81 MG tablet Take 81 mg by mouth daily as needed for mild pain.   FLUoxetine 40 MG capsule Commonly known as:  PROZAC Take 40 mg by mouth at bedtime.   losartan 50 MG tablet Commonly known as:  COZAAR Take 50 mg by mouth daily.    omeprazole 40 MG capsule Commonly known as:  PRILOSEC TAKE 1 CAPSULE (40 MG TOTAL) BY MOUTH DAILY. What changed:    when to take this  reasons to take this   ondansetron 4 MG disintegrating tablet Commonly known as:  ZOFRAN-ODT Take 1 tablet (4 mg total) by mouth every 6 (six) hours as needed for nausea.      Follow-up Information    Johnathan Hausen, MD. Schedule an appointment as soon as possible for a visit in 3 week(s).   Specialty:  General Surgery Contact information: [place] [address] [phone]           Signed: Pedro Earls 10/05/2018, 7:13 AM

## 2018-10-05 NOTE — Discharge Instructions (Signed)

## 2018-10-21 DIAGNOSIS — H26491 Other secondary cataract, right eye: Secondary | ICD-10-CM | POA: Diagnosis not present

## 2018-10-26 DIAGNOSIS — Z23 Encounter for immunization: Secondary | ICD-10-CM | POA: Diagnosis not present

## 2018-11-17 ENCOUNTER — Other Ambulatory Visit: Payer: Self-pay

## 2018-12-26 DIAGNOSIS — I10 Essential (primary) hypertension: Secondary | ICD-10-CM | POA: Diagnosis not present

## 2018-12-26 DIAGNOSIS — K219 Gastro-esophageal reflux disease without esophagitis: Secondary | ICD-10-CM | POA: Diagnosis not present

## 2018-12-26 DIAGNOSIS — F329 Major depressive disorder, single episode, unspecified: Secondary | ICD-10-CM | POA: Diagnosis not present

## 2018-12-26 DIAGNOSIS — E782 Mixed hyperlipidemia: Secondary | ICD-10-CM | POA: Diagnosis not present

## 2018-12-26 DIAGNOSIS — F419 Anxiety disorder, unspecified: Secondary | ICD-10-CM | POA: Diagnosis not present

## 2018-12-29 DIAGNOSIS — K219 Gastro-esophageal reflux disease without esophagitis: Secondary | ICD-10-CM | POA: Diagnosis not present

## 2018-12-29 DIAGNOSIS — E782 Mixed hyperlipidemia: Secondary | ICD-10-CM | POA: Diagnosis not present

## 2018-12-29 DIAGNOSIS — F419 Anxiety disorder, unspecified: Secondary | ICD-10-CM | POA: Diagnosis not present

## 2018-12-29 DIAGNOSIS — Z6826 Body mass index (BMI) 26.0-26.9, adult: Secondary | ICD-10-CM | POA: Diagnosis not present

## 2018-12-29 DIAGNOSIS — F329 Major depressive disorder, single episode, unspecified: Secondary | ICD-10-CM | POA: Diagnosis not present

## 2018-12-29 DIAGNOSIS — I10 Essential (primary) hypertension: Secondary | ICD-10-CM | POA: Diagnosis not present

## 2019-01-03 DIAGNOSIS — D485 Neoplasm of uncertain behavior of skin: Secondary | ICD-10-CM | POA: Diagnosis not present

## 2019-01-03 DIAGNOSIS — C44311 Basal cell carcinoma of skin of nose: Secondary | ICD-10-CM | POA: Diagnosis not present

## 2019-01-03 DIAGNOSIS — L570 Actinic keratosis: Secondary | ICD-10-CM | POA: Diagnosis not present

## 2019-01-03 DIAGNOSIS — Z85828 Personal history of other malignant neoplasm of skin: Secondary | ICD-10-CM | POA: Diagnosis not present

## 2019-01-03 DIAGNOSIS — D225 Melanocytic nevi of trunk: Secondary | ICD-10-CM | POA: Diagnosis not present

## 2019-01-03 DIAGNOSIS — L821 Other seborrheic keratosis: Secondary | ICD-10-CM | POA: Diagnosis not present

## 2019-01-11 DIAGNOSIS — C44311 Basal cell carcinoma of skin of nose: Secondary | ICD-10-CM | POA: Diagnosis not present

## 2019-01-11 DIAGNOSIS — Z85828 Personal history of other malignant neoplasm of skin: Secondary | ICD-10-CM | POA: Diagnosis not present

## 2019-07-05 DIAGNOSIS — L905 Scar conditions and fibrosis of skin: Secondary | ICD-10-CM | POA: Diagnosis not present

## 2019-07-05 DIAGNOSIS — D0462 Carcinoma in situ of skin of left upper limb, including shoulder: Secondary | ICD-10-CM | POA: Diagnosis not present

## 2019-07-05 DIAGNOSIS — L570 Actinic keratosis: Secondary | ICD-10-CM | POA: Diagnosis not present

## 2019-07-05 DIAGNOSIS — L821 Other seborrheic keratosis: Secondary | ICD-10-CM | POA: Diagnosis not present

## 2019-07-05 DIAGNOSIS — D485 Neoplasm of uncertain behavior of skin: Secondary | ICD-10-CM | POA: Diagnosis not present

## 2019-07-05 DIAGNOSIS — Z85828 Personal history of other malignant neoplasm of skin: Secondary | ICD-10-CM | POA: Diagnosis not present

## 2019-07-05 DIAGNOSIS — D040 Carcinoma in situ of skin of lip: Secondary | ICD-10-CM | POA: Diagnosis not present

## 2019-07-05 DIAGNOSIS — D225 Melanocytic nevi of trunk: Secondary | ICD-10-CM | POA: Diagnosis not present

## 2019-07-05 DIAGNOSIS — D0439 Carcinoma in situ of skin of other parts of face: Secondary | ICD-10-CM | POA: Diagnosis not present

## 2019-07-17 DIAGNOSIS — D040 Carcinoma in situ of skin of lip: Secondary | ICD-10-CM | POA: Diagnosis not present

## 2019-07-17 DIAGNOSIS — Z85828 Personal history of other malignant neoplasm of skin: Secondary | ICD-10-CM | POA: Diagnosis not present

## 2019-07-26 ENCOUNTER — Ambulatory Visit (HOSPITAL_COMMUNITY): Payer: Medicare Other | Attending: Internal Medicine

## 2019-07-26 ENCOUNTER — Other Ambulatory Visit: Payer: Self-pay

## 2019-07-26 DIAGNOSIS — I359 Nonrheumatic aortic valve disorder, unspecified: Secondary | ICD-10-CM | POA: Insufficient documentation

## 2019-08-23 ENCOUNTER — Ambulatory Visit: Payer: Medicare Other | Admitting: Cardiovascular Disease

## 2019-09-13 ENCOUNTER — Telehealth: Payer: Self-pay

## 2019-09-13 DIAGNOSIS — F329 Major depressive disorder, single episode, unspecified: Secondary | ICD-10-CM | POA: Diagnosis not present

## 2019-09-13 DIAGNOSIS — K219 Gastro-esophageal reflux disease without esophagitis: Secondary | ICD-10-CM | POA: Diagnosis not present

## 2019-09-13 DIAGNOSIS — I10 Essential (primary) hypertension: Secondary | ICD-10-CM | POA: Diagnosis not present

## 2019-09-13 DIAGNOSIS — E782 Mixed hyperlipidemia: Secondary | ICD-10-CM | POA: Diagnosis not present

## 2019-09-13 DIAGNOSIS — F419 Anxiety disorder, unspecified: Secondary | ICD-10-CM | POA: Diagnosis not present

## 2019-09-13 DIAGNOSIS — Z6826 Body mass index (BMI) 26.0-26.9, adult: Secondary | ICD-10-CM | POA: Diagnosis not present

## 2019-09-13 NOTE — Telephone Encounter (Signed)
DPR on file. lmtcb to change 9/18 appt visit type to office visit or reschedule her virtual appt for another day

## 2019-09-15 ENCOUNTER — Telehealth: Payer: Medicare Other | Admitting: Cardiovascular Disease

## 2019-10-02 ENCOUNTER — Telehealth: Payer: Self-pay

## 2019-10-02 NOTE — Telephone Encounter (Signed)
Pt ok with changing appt from virtual to [place] for 10/7

## 2019-10-04 ENCOUNTER — Encounter: Payer: Self-pay | Admitting: Cardiovascular Disease

## 2019-10-04 ENCOUNTER — Other Ambulatory Visit: Payer: Self-pay

## 2019-10-04 ENCOUNTER — Ambulatory Visit (INDEPENDENT_AMBULATORY_CARE_PROVIDER_SITE_OTHER): Payer: Medicare Other | Admitting: Cardiovascular Disease

## 2019-10-04 VITALS — BP 161/84 | HR 77 | Temp 97.1°F | Ht 66.0 in | Wt 163.0 lb

## 2019-10-04 DIAGNOSIS — I351 Nonrheumatic aortic (valve) insufficiency: Secondary | ICD-10-CM | POA: Diagnosis not present

## 2019-10-04 DIAGNOSIS — K219 Gastro-esophageal reflux disease without esophagitis: Secondary | ICD-10-CM | POA: Diagnosis not present

## 2019-10-04 DIAGNOSIS — I10 Essential (primary) hypertension: Secondary | ICD-10-CM

## 2019-10-04 DIAGNOSIS — R002 Palpitations: Secondary | ICD-10-CM | POA: Diagnosis not present

## 2019-10-04 DIAGNOSIS — E782 Mixed hyperlipidemia: Secondary | ICD-10-CM | POA: Diagnosis not present

## 2019-10-04 MED ORDER — METOPROLOL SUCCINATE ER 25 MG PO TB24: 25.0000 mg | ORAL_TABLET | Freq: Every day | ORAL | 3 refills | Status: DC

## 2019-10-04 NOTE — Patient Instructions (Addendum)
Medication Instructions:  Your physician has recommended you make the following change in your medication:   START METOPROLOL SUCCINATE (TOPROL XL) 25 MG BY MOUTH DAILY  If you need a refill on your cardiac medications before your next appointment, please call your pharmacy.   Lab work: Your physician recommends that you return for lab work in [place]  If you have labs (blood work) drawn today and your tests are completely normal, you will receive your results only by: Marland Kitchen MyChart Message (if you have MyChart) OR . A paper copy in the mail If you have any lab test that is abnormal or we need to change your treatment, we will call you to review the results.  Testing/Procedures: Your physician has requested that you have an echocardiogram. Echocardiography is a painless test that uses sound waves to create images of your heart. It provides your doctor with information about the size and shape of your heart and how well your heart's chambers and valves are working. This procedure takes approximately one hour. There are no restrictions for this procedure. LOCATION: [facility]: [place], [place],  [id number] DUE July 2021  Follow-Up: At [facility], you and your health needs are our priority.  As part of our continuing mission to provide you with exceptional heart care, we have created designated Provider Care Teams.  These Care Teams include your primary Cardiologist (physician) and Advanced Practice Providers (APPs -  Physician Assistants and Nurse Practitioners) who all work together to provide you with the care you need, when you need it. You will need a follow up appointment in 12 months with Dr. Quay Burow.  Please call our office 2 months in advance to schedule this appointment.    Any Other Special Instructions Will Be Listed Below (If Applicable). KEEP A BLOOD PRESSURE LOG FOR 30 DAYS THEN FOLLOW UP WITH A CLINICAL  PHARMACIST IN THE HYPERTENSION CLINIC. YOU WILL NEED AN IN-PERSON APPOINTMENT IN THE OFFICE. PLEASE CALL TO SCHEDULE THIS IN-PERSON APPOINTMENT IF YOU DID NOT ALREADY DO SO DURING YOUR LAST VISIT AT [facility].     Heart-Healthy Eating Plan Heart-healthy meal planning includes:  Eating less unhealthy fats.  Eating more healthy fats.  Making other changes in your diet. Talk with your doctor or a diet specialist (dietitian) to create an eating plan that is right for you. What is my plan? Your doctor may recommend an eating plan that includes:  Total fat: ______% or less of total calories a day.  Saturated fat: ______% or less of total calories a day.  Cholesterol: less than _________mg a day. What are tips for following this plan? Cooking Avoid frying your food. Try to bake, boil, grill, or broil it instead. You can also reduce fat by:  Removing the skin from poultry.  Removing all visible fats from meats.  Steaming vegetables in water or broth. Meal planning   At meals, divide your plate into four equal parts: ? Fill one-half of your plate with vegetables and green salads. ? Fill one-fourth of your plate with whole grains. ? Fill one-fourth of your plate with lean protein foods.  Eat 4-5 servings of vegetables per day. A serving of vegetables is: ? 1 cup of raw or cooked vegetables. ? 2 cups of raw leafy greens.  Eat 4-5 servings of fruit per day. A serving of fruit is: ? 1 medium whole fruit. ?  cup of dried fruit. ?  cup of fresh, frozen, or canned fruit. ?  cup of 100% fruit juice.  Eat more foods that have soluble fiber. These are apples, broccoli, carrots, beans, peas, and barley. Try to get 20-30 g of fiber per day.  Eat 4-5 servings of nuts, legumes, and seeds per week: ? 1 serving of dried beans or legumes equals  cup after being cooked. ? 1 serving of nuts is  cup. ? 1 serving of seeds equals 1 tablespoon. General information  Eat more  home-cooked food. Eat less restaurant, buffet, and fast food.  Limit or avoid alcohol.  Limit foods that are high in starch and sugar.  Avoid fried foods.  Lose weight if you are overweight.  Keep track of how much salt (sodium) you eat. This is important if you have high blood pressure. Ask your doctor to tell you more about this.  Try to add vegetarian meals each week. Fats  Choose healthy fats. These include olive oil and canola oil, flaxseeds, walnuts, almonds, and seeds.  Eat more omega-3 fats. These include salmon, mackerel, sardines, tuna, flaxseed oil, and ground flaxseeds. Try to eat fish at least 2 times each week.  Check food labels. Avoid foods with trans fats or high amounts of saturated fat.  Limit saturated fats. ? These are often found in animal products, such as meats, butter, and cream. ? These are also found in plant foods, such as palm oil, palm kernel oil, and coconut oil.  Avoid foods with partially hydrogenated oils in them. These have trans fats. Examples are stick margarine, some tub margarines, cookies, crackers, and other baked goods. What foods can I eat? Fruits All fresh, canned (in natural juice), or frozen fruits. Vegetables Fresh or frozen vegetables (raw, steamed, roasted, or grilled). Green salads. Grains Most grains. Choose whole wheat and whole grains most of the time. Rice and pasta, including brown rice and pastas made with whole wheat. Meats and other proteins Lean, well-trimmed beef, veal, pork, and lamb. Chicken and [place] without skin. All fish and shellfish. Wild duck, rabbit, pheasant, and venison. Egg whites or low-cholesterol egg substitutes. Dried beans, peas, lentils, and tofu. Seeds and most nuts. Dairy Low-fat or nonfat cheeses, including ricotta and mozzarella. Skim or 1% milk that is liquid, powdered, or evaporated. Buttermilk that is made with low-fat milk. Nonfat or low-fat yogurt. Fats and oils Non-hydrogenated (trans-free)  margarines. Vegetable oils, including soybean, sesame, sunflower, olive, peanut, safflower, corn, canola, and cottonseed. Salad dressings or mayonnaise made with a vegetable oil. Beverages Mineral water. Coffee and tea. Diet carbonated beverages. Sweets and desserts Sherbet, gelatin, and fruit ice. Small amounts of dark chocolate. Limit all sweets and desserts. Seasonings and condiments All seasonings and condiments. The items listed above may not be a complete list of foods and drinks you can eat. Contact a dietitian for more options. What foods should I avoid? Fruits Canned fruit in heavy syrup. Fruit in cream or butter sauce. Fried fruit. Limit coconut. Vegetables Vegetables cooked in cheese, cream, or butter sauce. Fried vegetables. Grains Breads that are made with saturated or trans fats, oils, or whole milk. Croissants. Sweet rolls. Donuts. High-fat crackers, such as cheese crackers. Meats and other proteins Fatty meats, such as hot dogs, ribs, sausage, bacon, rib-eye roast or steak. High-fat deli meats, such as salami and bologna. Caviar. Domestic duck and goose. Organ meats, such as liver. Dairy Cream, sour cream, cream cheese, and creamed cottage cheese. Whole-milk cheeses. Whole or 2% milk that is liquid, evaporated, or  condensed. Whole buttermilk. Cream sauce or high-fat cheese sauce. Yogurt that is made from whole milk. Fats and oils Meat fat, or shortening. Cocoa butter, hydrogenated oils, palm oil, coconut oil, palm kernel oil. Solid fats and shortenings, including bacon fat, salt pork, lard, and butter. Nondairy cream substitutes. Salad dressings with cheese or sour cream. Beverages Regular sodas and juice drinks with added sugar. Sweets and desserts Frosting. Pudding. Cookies. Cakes. Pies. Milk chocolate or white chocolate. Buttered syrups. Full-fat ice cream or ice cream drinks. The items listed above may not be a complete list of foods and drinks to avoid. Contact a  dietitian for more information. Summary  Heart-healthy meal planning includes eating less unhealthy fats, eating more healthy fats, and making other changes in your diet.  Eat a balanced diet. This includes fruits and vegetables, low-fat or nonfat dairy, lean protein, nuts and legumes, whole grains, and heart-healthy oils and fats. This information is not intended to replace advice given to you by your health care provider. Make sure you discuss any questions you have with your health care provider. Document Released: 06/14/2012 Document Revised: 02/17/2018 Document Reviewed: 01/21/2018 Elsevier Patient Education  2020 [employer].

## 2019-10-04 NOTE — Progress Notes (Signed)
10/04/2019 Judy Mitchell   [birth year]  PT:[id number]  Primary Physician Rosalee Kaufman, PA-C Primary Cardiologist: Lorretta Harp MD Garret Reddish, [place], [place]  HPI:  Judy Mitchell is a 68 y.o.  Caucasian female no children who is retired from working at the [employer]. She was referred by Clemmie Krill, PA-C for evaluation of PACs.  I last saw her in the office 08/16/2018.She has no prior cardiac history. Her only risk factors include treated hypertension. She is never had a heart attack or stroke. She denies chest pain or shortness of breath. She does have a hiatal hernia with GERD on omeprazole followed by Dr. Trilby Leaver  She noticed new onset palpitations approximately 6 months ago occurring in the early morning hours awaking feeling her from sleep frequently at around 4 AM .  There are no other associated symptoms. They are self-limited. She has limited caffeine and chocolate from her diet. Her thyroid function tests have been normal.  She was under a lot of stress at home with an ailing husband.  Her palpitations have improved with the addition of Prozac, Xanax and elimination of caffeine.  A 2D echo was performed that showed normal LV systolic function, mild LV dilatation with mild to moderate AI and mild to moderate pulmonary hypertension.  She underwent successful laparoscopic Nissen fundoplication by Dr. Kaylyn Lim which resulted in improvement in her reflux symptoms.  Since I saw her a year ago she is remained stable.  She still gets occasional nocturnal palpitations although we did not see these on event monitoring.  Recent 2D echo in July did show normal LV function with mild to moderate aortic insufficiency.  She denies chest pain or shortness of breath.   Current Meds  Medication Sig  . ALPRAZolam (XANAX) 0.25 MG tablet Take 0.125 mg by mouth 2 (two) times daily as needed for anxiety.   Marland Kitchen aspirin EC 81 MG tablet Take 81 mg by mouth daily as needed for  mild pain.  Marland Kitchen FLUoxetine (PROZAC) 40 MG capsule Take 40 mg by mouth at bedtime.   Marland Kitchen losartan (COZAAR) 50 MG tablet Take 50 mg by mouth daily.     Allergies  Allergen Reactions  . Sulfa Antibiotics Itching and Rash    Social History   Socioeconomic History  . Marital status: Married    Spouse name: Not on file  . Number of children: Not on file  . Years of education: Not on file  . Highest education level: Not on file  Occupational History  . Not on file  Social Needs  . Financial resource strain: Not on file  . Food insecurity    Worry: Not on file    Inability: Not on file  . Transportation needs    Medical: Not on file    Non-medical: Not on file  Tobacco Use  . Smoking status: Never Smoker  . Smokeless tobacco: Never Used  Substance and Sexual Activity  . Alcohol use: Yes    Comment: rarely  . Drug use: No  . Sexual activity: Not on file  Lifestyle  . Physical activity    Days per week: Not on file    Minutes per session: Not on file  . Stress: Not on file  Relationships  . Social Herbalist on phone: Not on file    Gets together: Not on file    Attends religious service: Not on file    Active member of club  or organization: Not on file    Attends meetings of clubs or organizations: Not on file    Relationship status: Not on file  . Intimate partner violence    Fear of current or ex partner: Not on file    Emotionally abused: Not on file    Physically abused: Not on file    Forced sexual activity: Not on file  Other Topics Concern  . Not on file  Social History Narrative  . Not on file     Review of Systems: General: negative for chills, fever, night sweats or weight changes.  Cardiovascular: negative for chest pain, dyspnea on exertion, edema, orthopnea, palpitations, paroxysmal nocturnal dyspnea or shortness of breath Dermatological: negative for rash Respiratory: negative for cough or wheezing Urologic: negative for hematuria  Abdominal: negative for nausea, vomiting, diarrhea, bright red blood per rectum, melena, or hematemesis Neurologic: negative for visual changes, syncope, or dizziness All other systems reviewed and are otherwise negative except as noted above.    Blood pressure (!) 161/84, pulse 77, temperature (!) 97.1 F (36.2 C), height 5\' 6"  (1.676 m), weight 163 lb (73.9 kg), SpO2 97 %.  General appearance: alert and no distress Neck: no adenopathy, no carotid bruit, no JVD, supple, symmetrical, trachea midline and thyroid not enlarged, symmetric, no tenderness/mass/nodules Lungs: clear to auscultation bilaterally Heart: Soft diastolic murmur at the left upper sternal border Extremities: extremities normal, atraumatic, no cyanosis or edema Pulses: 2+ and symmetric Skin: Skin color, texture, turgor normal. No rashes or lesions Neurologic: Alert and oriented X 3, normal strength and tone. Normal symmetric reflexes. Normal coordination and gait  EKG sinus rhythm at 71 with occasional PVCs and inferior Q waves of no clinical significance.  She did have early R wave transition.  I personally reviewed this EKG.  ASSESSMENT AND PLAN:   Essential hypertension History of essential hypertension with blood pressure measured today at 161/84.  She is on losartan.  She admits to dietary indiscretion with regards to salt.  I am going to start her on low-dose beta-blocker (Toprol-XL 25 mg a day) to treat both her hypertension and palpitations.  She will keep a blood pressure log for the next 30 days.  We will make an appointment for her to see Cyril Mourning in the office to review make appropriate changes  Palpitations History of palpitations with a negative event monitor.  She does admit to having a lot of stress at home and her palpitations have improved in the past on Prozac, Xanax and elimination of caffeine.  She still gets occasional palpitations in the evening hours.  I am going to add low-dose beta-blocker  empirically.  GERD (gastroesophageal reflux disease) History of GERD with reflux status post laparoscopic Nissen fundoplication by Dr. Kaylyn Lim with resolution of her symptoms.  Hyperlipidemia History of mild hyperlipidemia not on statin therapy with lipid profile performed 12/26/2018 revealing total cholesterol 212, LDL of 131 and HDL 56.  She does admit to dietary indiscretion.  We have agreed to modify/alter and improve her diet.  We will recheck a lipid liver profile in 3 months and make a decision with regards to statin therapy after that.  Aortic insufficiency Recent 2D echo performed 07/26/2027 revealed normal LV systolic function with mild to moderate  aortic insufficiency.  We will repeat a 2D echo in 12 months.      Lorretta Harp MD FACP,FACC,FAHA, [facility] 10/04/2019 10:24 AM

## 2019-10-04 NOTE — Assessment & Plan Note (Signed)
History of palpitations with a negative event monitor.  She does admit to having a lot of stress at home and her palpitations have improved in the past on Prozac, Xanax and elimination of caffeine.  She still gets occasional palpitations in the evening hours.  I am going to add low-dose beta-blocker empirically.

## 2019-10-04 NOTE — Assessment & Plan Note (Signed)
History of essential hypertension with blood pressure measured today at 161/84.  She is on losartan.  She admits to dietary indiscretion with regards to salt.  I am going to start her on low-dose beta-blocker (Toprol-XL 25 mg a day) to treat both her hypertension and palpitations.  She will keep a blood pressure log for the next 30 days.  We will make an appointment for her to see Cyril Mourning in the office to review make appropriate changes

## 2019-10-04 NOTE — Assessment & Plan Note (Signed)
History of GERD with reflux status post laparoscopic Nissen fundoplication by Dr. Kaylyn Lim with resolution of her symptoms.

## 2019-10-04 NOTE — Assessment & Plan Note (Signed)
History of mild hyperlipidemia not on statin therapy with lipid profile performed 12/26/2018 revealing total cholesterol 212, LDL of 131 and HDL 56.  She does admit to dietary indiscretion.  We have agreed to modify/alter and improve her diet.  We will recheck a lipid liver profile in 3 months and make a decision with regards to statin therapy after that.

## 2019-10-04 NOTE — Assessment & Plan Note (Signed)
Recent 2D echo performed 07/26/2027 revealed normal LV systolic function with mild to moderate  aortic insufficiency.  We will repeat a 2D echo in 12 months.

## 2019-10-10 DIAGNOSIS — Z23 Encounter for immunization: Secondary | ICD-10-CM | POA: Diagnosis not present

## 2019-11-07 ENCOUNTER — Other Ambulatory Visit: Payer: Self-pay

## 2019-11-07 ENCOUNTER — Ambulatory Visit (INDEPENDENT_AMBULATORY_CARE_PROVIDER_SITE_OTHER): Payer: Medicare Other | Admitting: Pharmacist

## 2019-11-07 VITALS — BP 144/72 | HR 64 | Ht 66.5 in | Wt 169.8 lb

## 2019-11-07 DIAGNOSIS — I10 Essential (primary) hypertension: Secondary | ICD-10-CM | POA: Diagnosis not present

## 2019-11-07 MED ORDER — VALSARTAN 80 MG PO TABS: 80.0000 mg | ORAL_TABLET | Freq: Every day | ORAL | 1 refills | Status: DC

## 2019-11-07 NOTE — Patient Instructions (Addendum)
Return for a  follow up appointment in 4 weeks  Check your blood pressure at home daily (if able) and keep record of the readings.  Take your BP meds as follows: *STOP taking losartan 50gm* *STARt taking valsartan 80mg  daily*  Bring all of your meds, your BP cuff and your record of home blood pressures to your next appointment.  Exercise as you're able, try to walk approximately 30 minutes per day.  Keep salt intake to a minimum, especially watch canned and prepared boxed foods.  Eat more fresh fruits and vegetables and fewer canned items.  Avoid eating in fast food restaurants.    HOW TO TAKE YOUR BLOOD PRESSURE: . Rest 5 minutes before taking your blood pressure. .  Don't smoke or drink caffeinated beverages for at least 30 minutes before. . Take your blood pressure before (not after) you eat. . Sit comfortably with your back supported and both feet on the floor (don't cross your legs). . Elevate your arm to heart level on a table or a desk. . Use the proper sized cuff. It should fit smoothly and snugly around your bare upper arm. There should be enough room to slip a fingertip under the cuff. The bottom edge of the cuff should be 1 inch above the crease of the elbow. . Ideally, take 3 measurements at one sitting and record the average.

## 2019-11-07 NOTE — Progress Notes (Signed)
Patient ID: Judy Mitchell                 DOB: Nov 08, 1951                      MRN: [id number]     HPI:  Judy Mitchell is a 68 y.o. female referred by Dr. Gwenlyn Found to HTN clinic.  PMH includes hypertension, palpitations, GERD, hyperlipidemia, skin cancer, and aortic insufficiency. Patient reports improvement in palpitations by decreasing caffeine intake and starting anxiolytic medication.  During last OV with Dr Gwenlyn Found she was started on low sodium diet and low dose metoprolol to help with her BP and palpitation.   Current HTN meds:  Losartan 50mg  daily - taking for over 6 years Metoprolol succinate 25mg  daily  BP goal: 130/80 or lower  Social History: denies tobacco use, decreased caffeine intake , rare alcohol intake  Diet:working on low sodium  And mainly home cooked meals  Exercise: activities of daily living  Home BP readings:  18 readings; average 143/67 (HR 59-81bpm)  Wt Readings from Last 3 Encounters:  11/07/19 169 lb 12.8 oz (77 kg)  10/04/19 163 lb (73.9 kg)  10/03/18 160 lb (72.6 kg)   BP Readings from Last 3 Encounters:  11/07/19 (!) 144/72  10/04/19 (!) 161/84  10/05/18 (!) 154/72   Pulse Readings from Last 3 Encounters:  11/07/19 64  10/04/19 77  10/05/18 80    Past Medical History:  Diagnosis Date  . Aortic insufficiency    per ECHO 06-27-18 epic   . Family history of adverse reaction to anesthesia    nausea, vomitting  . Hiatal hernia 04/13/2017  . High blood pressure 02/23/2017  . HLD (hyperlipidemia)   . Palpitations   . PONV (postoperative nausea and vomiting)     Current Outpatient Medications on File Prior to Visit  Medication Sig Dispense Refill  . ALPRAZolam (XANAX) 0.25 MG tablet Take 0.125 mg by mouth 2 (two) times daily as needed for anxiety.     Marland Kitchen FLUoxetine (PROZAC) 40 MG capsule Take 40 mg by mouth at bedtime.   2  . metoprolol succinate (TOPROL XL) 25 MG 24 hr tablet Take 1 tablet (25 mg total) by mouth daily. 90 tablet 3   No current  facility-administered medications on file prior to visit.     Allergies  Allergen Reactions  . Sulfa Antibiotics Itching and Rash    Blood pressure (!) 144/72, pulse 64, height 5' 6.5" (1.689 m), weight 169 lb 12.8 oz (77 kg), SpO2 97 %.  Essential hypertension Patient is working on lifestyle modifications like decreasing sodium  And caffeine in diet. She report improved palpitations since starting metoprolol and denies ADR to her current medication.  Will discontinue losartan 50mg  and start valsartan 80mg  daily. Patientt is to continue working on positive lifestyle modifications, monitor BP twice daily and brig records to HTN clinic follow up in 4 weeks. Plan to increase valsartan to 160mg  daily if needed was already discussed with patient as she agrees with it.   Verta Riedlinger Rodriguez-Guzman PharmD, BCPS, [place] [address] [place] [address] 11/12/2019 6:11 PM

## 2019-11-12 ENCOUNTER — Encounter: Payer: Self-pay | Admitting: Pharmacist

## 2019-11-12 NOTE — Assessment & Plan Note (Signed)
Patient is working on lifestyle modifications like decreasing sodium  And caffeine in diet. She report improved palpitations since starting metoprolol and denies ADR to her current medication.  Will discontinue losartan 50mg  and start valsartan 80mg  daily. Patientt is to continue working on positive lifestyle modifications, monitor BP twice daily and brig records to HTN clinic follow up in 4 weeks. Plan to increase valsartan to 160mg  daily if needed was already discussed with patient as she agrees with it.

## 2019-11-20 ENCOUNTER — Other Ambulatory Visit: Payer: Self-pay

## 2019-11-30 ENCOUNTER — Other Ambulatory Visit: Payer: Self-pay | Admitting: Cardiovascular Disease

## 2019-12-05 ENCOUNTER — Ambulatory Visit: Payer: Medicare Other

## 2019-12-05 ENCOUNTER — Encounter: Payer: Self-pay | Admitting: Pharmacist

## 2019-12-05 ENCOUNTER — Other Ambulatory Visit: Payer: Self-pay

## 2019-12-05 ENCOUNTER — Ambulatory Visit (INDEPENDENT_AMBULATORY_CARE_PROVIDER_SITE_OTHER): Payer: Medicare Other | Admitting: Pharmacist

## 2019-12-05 VITALS — BP 146/88 | HR 70 | Wt 172.6 lb

## 2019-12-05 DIAGNOSIS — I10 Essential (primary) hypertension: Secondary | ICD-10-CM | POA: Diagnosis not present

## 2019-12-05 MED ORDER — VALSARTAN 160 MG PO TABS: 160.0000 mg | ORAL_TABLET | Freq: Every day | ORAL | 1 refills | Status: DC

## 2019-12-05 NOTE — Progress Notes (Signed)
Patient ID: Judy Mitchell                 DOB: 1951/06/28                      MRN: [id number]     HPI:  Judy Mitchell is a 68 y.o. female referred by Dr. Gwenlyn Mitchell to HTN clinic.  PMH includes hypertension, palpitations, GERD, hyperlipidemia, skin cancer, and aortic insufficiency. Patient reports improvement in palpitations by decreasing caffeine intake and starting anxiolytic medication.  During last OV changes her losartan 50mg  to valsartan 80mg  daily and encouraged low sodium diet.   Patient presents today for HTN follow up. BP remains above goal of 130/80, but denies problems with new medication.   Current HTN meds:  Valsartan 80mg  daily - supper Metoprolol succinate 25mg  daily- in am  BP goal: 130/80 or lower  Social History: denies tobacco use, decreased caffeine intake , rare alcohol intake  Diet:working on low sodium and mainly home cooked meals  Exercise: activities of daily living  Home BP readings:  17 readings; average 142/64; HR range 53-66bpm  Wt Readings from Last 3 Encounters:  12/05/19 172 lb 9.6 oz (78.3 kg)  11/07/19 169 lb 12.8 oz (77 kg)  10/04/19 163 lb (73.9 kg)   BP Readings from Last 3 Encounters:  12/05/19 (!) 146/88  11/07/19 (!) 144/72  10/04/19 (!) 161/84   Pulse Readings from Last 3 Encounters:  12/05/19 70  11/07/19 64  10/04/19 77    Past Medical History:  Diagnosis Date  . Aortic insufficiency    per ECHO 06-27-18 epic   . Family history of adverse reaction to anesthesia    nausea, vomitting  . Hiatal hernia 04/13/2017  . High blood pressure 02/23/2017  . HLD (hyperlipidemia)   . Palpitations   . PONV (postoperative nausea and vomiting)     Current Outpatient Medications on File Prior to Visit  Medication Sig Dispense Refill  . ALPRAZolam (XANAX) 0.25 MG tablet Take 0.125 mg by mouth 2 (two) times daily as needed for anxiety.     Marland Kitchen FLUoxetine (PROZAC) 40 MG capsule Take 40 mg by mouth at bedtime.   2  . metoprolol succinate (TOPROL XL)  25 MG 24 hr tablet Take 1 tablet (25 mg total) by mouth daily. 90 tablet 3   No current facility-administered medications on file prior to visit.     Allergies  Allergen Reactions  . Sulfa Antibiotics Itching and Rash    Blood pressure (!) 146/88, pulse 70, weight 172 lb 9.6 oz (78.3 kg), SpO2 97 %.  Essential hypertension Blood pressure remains above goal and anxiety plays a big role on the BP numbers. Checking her BP twice daily increases her anxiety as well. Will increase valsartan to 160mg  daily, continue metoprolol succinate 25mg , follow up in 4-5 weeks and repeat BMET during next OV. Patient was also intrusted to check BP only once daily.    Judy Mitchell PharmD, BCPS, [place] [id number] 12/05/2019 3:44 PM

## 2019-12-05 NOTE — Assessment & Plan Note (Signed)
Blood pressure remains above goal and anxiety plays a big role on the BP numbers. Checking her BP twice daily increases her anxiety as well. Will increase valsartan to 160mg  daily, continue metoprolol succinate 25mg , follow up in 4-5 weeks and repeat BMET during next OV. Patient was also intrusted to check BP only once daily.

## 2019-12-05 NOTE — Patient Instructions (Addendum)
Return for a  follow up appointment in  5 weeks  Check your blood pressure at home daily (if able) and keep record of the readings.  Take your BP meds as follows: *INCREASE Valsartan dose to 160mg  every evening* *Okay to use clotrimazole cream for fungal treatment*  Bring all of your meds, your BP cuff and your record of home blood pressures to your next appointment.  Exercise as you're able, try to walk approximately 30 minutes per day.  Keep salt intake to a minimum, especially watch canned and prepared boxed foods.  Eat more fresh fruits and vegetables and fewer canned items.  Avoid eating in fast food restaurants.    HOW TO TAKE YOUR BLOOD PRESSURE: . Rest 5 minutes before taking your blood pressure. .  Don't smoke or drink caffeinated beverages for at least 30 minutes before. . Take your blood pressure before (not after) you eat. . Sit comfortably with your back supported and both feet on the floor (don't cross your legs). . Elevate your arm to heart level on a table or a desk. . Use the proper sized cuff. It should fit smoothly and snugly around your bare upper arm. There should be enough room to slip a fingertip under the cuff. The bottom edge of the cuff should be 1 inch above the crease of the elbow. . Ideally, take 3 measurements at one sitting and record the average.

## 2019-12-08 DIAGNOSIS — Z961 Presence of intraocular lens: Secondary | ICD-10-CM | POA: Diagnosis not present

## 2019-12-08 DIAGNOSIS — H16213 Exposure keratoconjunctivitis, bilateral: Secondary | ICD-10-CM | POA: Diagnosis not present

## 2020-01-08 LAB — HEPATIC FUNCTION PANEL
ALT: 15 IU/L (ref 0–32)
AST: 20 IU/L (ref 0–40)
Albumin: 4.5 g/dL (ref 3.8–4.8)
Alkaline Phosphatase: 86 IU/L (ref 39–117)
Bilirubin Total: 0.5 mg/dL (ref 0.0–1.2)
Bilirubin, Direct: 0.15 mg/dL (ref 0.00–0.40)
Total Protein: 6.4 g/dL (ref 6.0–8.5)

## 2020-01-08 LAB — LIPID PANEL
Chol/HDL Ratio: 4.7 ratio — ABNORMAL HIGH (ref 0.0–4.4)
Cholesterol, Total: 232 mg/dL — ABNORMAL HIGH (ref 100–199)
HDL: 49 mg/dL (ref >39)
LDL Chol Calc (NIH): 164 mg/dL — ABNORMAL HIGH (ref 0–99)
Triglycerides: 106 mg/dL (ref 0–149)
VLDL Cholesterol Cal: 19 mg/dL (ref 5–40)

## 2020-01-09 ENCOUNTER — Telehealth: Payer: Self-pay

## 2020-01-09 DIAGNOSIS — E782 Mixed hyperlipidemia: Secondary | ICD-10-CM

## 2020-01-09 DIAGNOSIS — E7800 Pure hypercholesterolemia, unspecified: Secondary | ICD-10-CM

## 2020-01-09 DIAGNOSIS — I10 Essential (primary) hypertension: Secondary | ICD-10-CM

## 2020-01-09 MED ORDER — ATORVASTATIN CALCIUM 20 MG PO TABS: 20.0000 mg | ORAL_TABLET | Freq: Every day | ORAL | 3 refills | Status: DC

## 2020-01-09 NOTE — Telephone Encounter (Signed)
-----   Message from Lorretta Harp, MD sent at 01/08/2020  4:27 PM EST ----- Moderately elevated LDL at 164.  Begin atorvastatin 20 mg a day recheck lipid liver profile in 2 months for primary prevention.

## 2020-01-09 NOTE — Telephone Encounter (Signed)
Called and gave pt results and put in new orders. Verbalized understanding.

## 2020-01-15 NOTE — Progress Notes (Signed)
Patient ID: EVOLETTE LASSWELL                 DOB: 29-Dec-1950                      MRN: [id number]     HPI:  Judy Mitchell is a 69 y.o. female referred by Dr. Gwenlyn Found to HTN clinic.  PMH includes hypertension, palpitations, GERD, hyperlipidemia, skin cancer, and aortic insufficiency. Patient reports improvement in palpitations by decreasing caffeine intake and starting anxiolytic medication.  During last OV changes her valsartan dose was increased from80mg  daily to 160mg  daily and encouraged low sodium diet. Patient presents today for HTN follow up. BP remains greatly improved at home and and denies problems with new medication.   Current HTN meds:  Valsartan 160mg  daily - supper Metoprolol succinate 25mg  daily- in am  BP goal: 130/80 or lower  Social History: denies tobacco use, decreased caffeine intake , rare alcohol intake  Diet:working on low sodium and mainly home cooked meals  Exercise: increasing physical activity with more walking.  Home BP readings:  18 readings; average 128/64 (range 123456 systolic and 0000000 diastolic); HR range [id number]  Wt Readings from Last 3 Encounters:  01/16/20 165 lb 6.4 oz (75 kg)  12/05/19 172 lb 9.6 oz (78.3 kg)  11/07/19 169 lb 12.8 oz (77 kg)   BP Readings from Last 3 Encounters:  01/16/20 128/66  12/05/19 (!) 146/88  11/07/19 (!) 144/72   Pulse Readings from Last 3 Encounters:  01/16/20 66  12/05/19 70  11/07/19 64    Past Medical History:  Diagnosis Date  . Aortic insufficiency    per ECHO 06-27-18 epic   . Family history of adverse reaction to anesthesia    nausea, vomitting  . Hiatal hernia 04/13/2017  . High blood pressure 02/23/2017  . HLD (hyperlipidemia)   . Palpitations   . PONV (postoperative nausea and vomiting)     Current Outpatient Medications on File Prior to Visit  Medication Sig Dispense Refill  . ALPRAZolam (XANAX) 0.25 MG tablet Take 0.125 mg by mouth 2 (two) times daily as needed for anxiety.     Marland Kitchen atorvastatin  (LIPITOR) 20 MG tablet Take 1 tablet (20 mg total) by mouth daily. 90 tablet 3  . FLUoxetine (PROZAC) 40 MG capsule Take 40 mg by mouth at bedtime.   2  . metoprolol succinate (TOPROL XL) 25 MG 24 hr tablet Take 1 tablet (25 mg total) by mouth daily. 90 tablet 3  . valsartan (DIOVAN) 160 MG tablet Take 1 tablet (160 mg total) by mouth daily. Note dose change 90 tablet 1   No current facility-administered medications on file prior to visit.    Allergies  Allergen Reactions  . Sulfa Antibiotics Itching and Rash    Blood pressure 128/66, pulse 66, height 5' 6.5" (1.689 m), weight 165 lb 6.4 oz (75 kg), SpO2 96 %.  Essential hypertension BP at goal today during OV and at home. Patient noted significant sensitivity to sodium intake after 72mmHg  increased in systolic pressure day after salty meal. She continues to work on her weigh loss and anxiety management. Will continue current medication without changes and follow up as needed with HTN clinic.    Dawne Casali Rodriguez-Guzman PharmD, BCPS, [place] [id number] 01/16/2020 8:50 AM

## 2020-01-16 ENCOUNTER — Ambulatory Visit (INDEPENDENT_AMBULATORY_CARE_PROVIDER_SITE_OTHER): Payer: Medicare Other | Admitting: Pharmacist

## 2020-01-16 ENCOUNTER — Other Ambulatory Visit: Payer: Self-pay

## 2020-01-16 VITALS — BP 128/66 | HR 66 | Ht 66.5 in | Wt 165.4 lb

## 2020-01-16 DIAGNOSIS — I10 Essential (primary) hypertension: Secondary | ICD-10-CM

## 2020-01-16 NOTE — Assessment & Plan Note (Signed)
BP at goal today during OV and at home. Patient noted significant sensitivity to sodium intake after 40mmHg  increased in systolic pressure day after salty meal. She continues to work on her weigh loss and anxiety management. Will continue current medication without changes and follow up as needed with HTN clinic.

## 2020-01-16 NOTE — Patient Instructions (Signed)
Return for a  follow up appointment AS NEEDED  Check your blood pressure at home daily (if able) and keep record of the readings.  Take your BP meds as follows: *NO MEDICATION CHANGES*  Bring all of your meds, your BP cuff and your record of home blood pressures to your next appointment.  Exercise as you're able, try to walk approximately 30 minutes per day.  Keep salt intake to a minimum, especially watch canned and prepared boxed foods.  Eat more fresh fruits and vegetables and fewer canned items.  Avoid eating in fast food restaurants.    HOW TO TAKE YOUR BLOOD PRESSURE: . Rest 5 minutes before taking your blood pressure. .  Don't smoke or drink caffeinated beverages for at least 30 minutes before. . Take your blood pressure before (not after) you eat. . Sit comfortably with your back supported and both feet on the floor (don't cross your legs). . Elevate your arm to heart level on a table or a desk. . Use the proper sized cuff. It should fit smoothly and snugly around your bare upper arm. There should be enough room to slip a fingertip under the cuff. The bottom edge of the cuff should be 1 inch above the crease of the elbow. . Ideally, take 3 measurements at one sitting and record the average.    

## 2020-01-22 DIAGNOSIS — Z1231 Encounter for screening mammogram for malignant neoplasm of breast: Secondary | ICD-10-CM | POA: Diagnosis not present

## 2020-01-23 ENCOUNTER — Ambulatory Visit: Payer: Medicare Other

## 2020-02-01 ENCOUNTER — Ambulatory Visit: Payer: Medicare Other | Attending: Internal Medicine

## 2020-02-01 DIAGNOSIS — Z23 Encounter for immunization: Secondary | ICD-10-CM | POA: Insufficient documentation

## 2020-02-01 NOTE — Progress Notes (Signed)
   Covid-19 Vaccination Clinic  Name:  Judy Mitchell    MRN: [id number] DOB: [birth year]  02/01/2020  [name] was observed post Covid-19 immunization for 15 minutes without incidence. She was provided with Vaccine Information Sheet and instruction to access the V-Safe system.   [name] was instructed to call 911 with any severe reactions post vaccine: Marland Kitchen Difficulty breathing  . Swelling of your face and throat  . A fast heartbeat  . A bad rash all over your body  . Dizziness and weakness    Immunizations Administered    Name Date Dose VIS Date Route   Pfizer COVID-19 Vaccine 02/01/2020  1:54 PM 0.3 mL 12/08/2019 Intramuscular   Manufacturer: [place]   Lot: [id number]   [place]: [id number]

## 2020-02-03 ENCOUNTER — Ambulatory Visit: Payer: Medicare Other

## 2020-02-23 DIAGNOSIS — E1010 Type 1 diabetes mellitus with ketoacidosis without coma: Secondary | ICD-10-CM | POA: Diagnosis not present

## 2020-02-23 DIAGNOSIS — E7849 Other hyperlipidemia: Secondary | ICD-10-CM | POA: Diagnosis not present

## 2020-02-27 ENCOUNTER — Ambulatory Visit: Payer: Medicare Other | Attending: Internal Medicine

## 2020-02-27 DIAGNOSIS — Z23 Encounter for immunization: Secondary | ICD-10-CM | POA: Insufficient documentation

## 2020-02-27 NOTE — Progress Notes (Signed)
   Covid-19 Vaccination Clinic  Name:  Judy Mitchell    MRN: [id number] DOB: Apr 21, 1951  02/27/2020  [name] was observed post Covid-19 immunization for 15 minutes without incident. She was provided with Vaccine Information Sheet and instruction to access the V-Safe system.   [name] was instructed to call 911 with any severe reactions post vaccine: Marland Kitchen Difficulty breathing  . Swelling of face and throat  . A fast heartbeat  . A bad rash all over body  . Dizziness and weakness   Immunizations Administered    Name Date Dose VIS Date Route   Pfizer COVID-19 Vaccine 02/27/2020  8:25 AM 0.3 mL 12/08/2019 Intramuscular   Manufacturer: [place]   Lot: [id number]   Leith: [id number]

## 2020-03-18 DIAGNOSIS — I10 Essential (primary) hypertension: Secondary | ICD-10-CM | POA: Diagnosis not present

## 2020-03-18 DIAGNOSIS — E782 Mixed hyperlipidemia: Secondary | ICD-10-CM | POA: Diagnosis not present

## 2020-03-18 DIAGNOSIS — E7800 Pure hypercholesterolemia, unspecified: Secondary | ICD-10-CM | POA: Diagnosis not present

## 2020-03-19 LAB — HEPATIC FUNCTION PANEL
ALT: 8 IU/L (ref 0–32)
AST: 15 IU/L (ref 0–40)
Albumin: 4.1 g/dL (ref 3.8–4.8)
Alkaline Phosphatase: 93 IU/L (ref 39–117)
Bilirubin Total: 0.5 mg/dL (ref 0.0–1.2)
Bilirubin, Direct: 0.13 mg/dL (ref 0.00–0.40)
Total Protein: 5.9 g/dL — ABNORMAL LOW (ref 6.0–8.5)

## 2020-03-19 LAB — LIPID PANEL
Chol/HDL Ratio: 3 ratio (ref 0.0–4.4)
Cholesterol, Total: 159 mg/dL (ref 100–199)
HDL: 53 mg/dL (ref >39)
LDL Chol Calc (NIH): 88 mg/dL (ref 0–99)
Triglycerides: 97 mg/dL (ref 0–149)
VLDL Cholesterol Cal: 18 mg/dL (ref 5–40)

## 2020-03-27 DIAGNOSIS — E7849 Other hyperlipidemia: Secondary | ICD-10-CM | POA: Diagnosis not present

## 2020-03-27 DIAGNOSIS — I10 Essential (primary) hypertension: Secondary | ICD-10-CM | POA: Diagnosis not present

## 2020-04-15 DIAGNOSIS — Z6827 Body mass index (BMI) 27.0-27.9, adult: Secondary | ICD-10-CM | POA: Diagnosis not present

## 2020-04-15 DIAGNOSIS — M79601 Pain in right arm: Secondary | ICD-10-CM | POA: Diagnosis not present

## 2020-04-15 DIAGNOSIS — Y92009 Unspecified place in unspecified non-institutional (private) residence as the place of occurrence of the external cause: Secondary | ICD-10-CM | POA: Diagnosis not present

## 2020-04-15 DIAGNOSIS — M25531 Pain in right wrist: Secondary | ICD-10-CM | POA: Diagnosis not present

## 2020-05-24 ENCOUNTER — Telehealth: Payer: Self-pay | Admitting: Cardiovascular Disease

## 2020-05-24 NOTE — Telephone Encounter (Signed)
  *  STAT* If patient is at the pharmacy, call can be transferred to refill team.   1. Which medications need to be refilled? (please list name of each medication and dose if known) valsartan (DIOVAN) 160 MG tablet  2. Which pharmacy/location (including street and city if local pharmacy) is medication to be sent to?  [facility] [address] [place]  3. Do they need a 30 day or 90 day supply? 90 days

## 2020-05-28 ENCOUNTER — Other Ambulatory Visit: Payer: Self-pay | Admitting: Cardiovascular Disease

## 2020-07-10 ENCOUNTER — Other Ambulatory Visit: Payer: Self-pay

## 2020-07-10 ENCOUNTER — Ambulatory Visit (HOSPITAL_COMMUNITY): Payer: Medicare Other | Attending: Cardiology

## 2020-07-10 DIAGNOSIS — I351 Nonrheumatic aortic (valve) insufficiency: Secondary | ICD-10-CM | POA: Diagnosis not present

## 2020-08-12 DIAGNOSIS — Z1389 Encounter for screening for other disorder: Secondary | ICD-10-CM | POA: Diagnosis not present

## 2020-08-12 DIAGNOSIS — F329 Major depressive disorder, single episode, unspecified: Secondary | ICD-10-CM | POA: Diagnosis not present

## 2020-08-12 DIAGNOSIS — E782 Mixed hyperlipidemia: Secondary | ICD-10-CM | POA: Diagnosis not present

## 2020-08-12 DIAGNOSIS — F419 Anxiety disorder, unspecified: Secondary | ICD-10-CM | POA: Diagnosis not present

## 2020-08-12 DIAGNOSIS — Z6826 Body mass index (BMI) 26.0-26.9, adult: Secondary | ICD-10-CM | POA: Diagnosis not present

## 2020-08-12 DIAGNOSIS — K219 Gastro-esophageal reflux disease without esophagitis: Secondary | ICD-10-CM | POA: Diagnosis not present

## 2020-08-12 DIAGNOSIS — Z1331 Encounter for screening for depression: Secondary | ICD-10-CM | POA: Diagnosis not present

## 2020-08-12 DIAGNOSIS — I10 Essential (primary) hypertension: Secondary | ICD-10-CM | POA: Diagnosis not present

## 2020-08-19 ENCOUNTER — Telehealth: Payer: Self-pay | Admitting: Cardiovascular Disease

## 2020-08-19 NOTE — Telephone Encounter (Signed)
°*  STAT* If patient is at the pharmacy, call can be transferred to refill team.   1. Which medications need to be refilled? (please list name of each medication and dose if known) Valsartan 160 mg  2. Which pharmacy/location (including street and city if local pharmacy) is medication to be sent to? [facility]  3. Do they need a 30 day or 90 day supply? 90 patient also has apt in 10/04/20

## 2020-08-20 ENCOUNTER — Other Ambulatory Visit: Payer: Self-pay

## 2020-08-20 MED ORDER — VALSARTAN 160 MG PO TABS: 160.0000 mg | ORAL_TABLET | Freq: Every day | ORAL | 1 refills | Status: DC

## 2020-09-16 ENCOUNTER — Other Ambulatory Visit: Payer: Self-pay | Admitting: Cardiovascular Disease

## 2020-10-04 ENCOUNTER — Ambulatory Visit (INDEPENDENT_AMBULATORY_CARE_PROVIDER_SITE_OTHER): Payer: Medicare Other | Admitting: Cardiovascular Disease

## 2020-10-04 ENCOUNTER — Encounter: Payer: Self-pay | Admitting: Cardiovascular Disease

## 2020-10-04 ENCOUNTER — Other Ambulatory Visit: Payer: Self-pay

## 2020-10-04 VITALS — BP 144/65 | HR 65 | Ht 66.5 in | Wt 169.8 lb

## 2020-10-04 DIAGNOSIS — I351 Nonrheumatic aortic (valve) insufficiency: Secondary | ICD-10-CM

## 2020-10-04 DIAGNOSIS — I10 Essential (primary) hypertension: Secondary | ICD-10-CM | POA: Diagnosis not present

## 2020-10-04 DIAGNOSIS — E782 Mixed hyperlipidemia: Secondary | ICD-10-CM | POA: Diagnosis not present

## 2020-10-04 DIAGNOSIS — R002 Palpitations: Secondary | ICD-10-CM | POA: Diagnosis not present

## 2020-10-04 MED ORDER — METOPROLOL SUCCINATE ER 50 MG PO TB24: 50.0000 mg | ORAL_TABLET | Freq: Every day | ORAL | 3 refills | Status: DC

## 2020-10-04 NOTE — Progress Notes (Signed)
10/04/2020 Judy Mitchell   1951-04-28  [id number]  Primary Physician Rosalee Kaufman, PA-C Primary Cardiologist: Lorretta Harp MD Garret Reddish, [place], [place]  HPI:  Judy Mitchell is a 69 y.o.  Caucasian female no children who is retired from working at the [employer]. She was referred by Clemmie Krill, PA-C for evaluation of PACs.I last saw her in the office  10/04/2019.She has no prior cardiac history. Her only risk factors include treated hypertension. She is never had a heart attack or stroke. She denies chest pain or shortness of breath. She does have a hiatal hernia with GERD on omeprazole followed by Dr. Trilby Leaver Shenoticed new onset palpitations approximately 6 months ago occurring in the early morning hours awaking feeling her from sleep frequently at around 4 AM.There are no other associated symptoms. They are self-limited. She has limited caffeine and chocolate from her diet. Her thyroid function tests have been normal.  She was under a lot of stress at home with an ailing husband. Her palpitations have improved with the addition of Prozac, Xanax and elimination of caffeine. A 2D echo was performed that showed normal LV systolic function, mild LV dilatation with mild to moderate AI and mild to moderate pulmonary hypertension.  She underwent successful laparoscopic Nissen fundoplication by Dr. Kaylyn Lim which resulted in improvement in her reflux symptoms.  Since I saw her a year ago she is remained stable.   She unfortunately lost her mother this past summer for metastatic melanoma. She describes her as "her best friend". She has noticed increased frequency and severity of palpitations in the last several weeks without identifiable cause. These had improved with abstaining from caffeine intake, anxiety lytics and low-dose beta-blocker. She denies chest pain or shortness of breath.    Current Meds  Medication Sig  . ALPRAZolam (XANAX) 0.25  MG tablet Take 0.125 mg by mouth 2 (two) times daily as needed for anxiety.   Marland Kitchen atorvastatin (LIPITOR) 20 MG tablet Take 1 tablet (20 mg total) by mouth daily.  Marland Kitchen FLUoxetine (PROZAC) 40 MG capsule Take 40 mg by mouth at bedtime.   . metoprolol succinate (TOPROL-XL) 50 MG 24 hr tablet Take 1 tablet (50 mg total) by mouth daily.  . valsartan (DIOVAN) 160 MG tablet Take 1 tablet (160 mg total) by mouth daily.  . [DISCONTINUED] metoprolol succinate (TOPROL-XL) 25 MG 24 hr tablet TAKE 1 TABLET BY MOUTH EVERY DAY     Allergies  Allergen Reactions  . Sulfa Antibiotics Itching and Rash    Social History   Socioeconomic History  . Marital status: Married    Spouse name: Not on file  . Number of children: Not on file  . Years of education: Not on file  . Highest education level: Not on file  Occupational History  . Not on file  Tobacco Use  . Smoking status: Never Smoker  . Smokeless tobacco: Never Used  Vaping Use  . Vaping Use: Never used  Substance and Sexual Activity  . Alcohol use: Yes    Comment: rarely  . Drug use: No  . Sexual activity: Not on file  Other Topics Concern  . Not on file  Social History Narrative  . Not on file   Social Determinants of Health   Financial Resource Strain:   . Difficulty of Paying Living Expenses: Not on file  Food Insecurity:   . Worried About Charity fundraiser in the Last Year: Not on file  .  Ran Out of Food in the Last Year: Not on file  Transportation Needs:   . Lack of Transportation (Medical): Not on file  . Lack of Transportation (Non-Medical): Not on file  Physical Activity:   . Days of Exercise per Week: Not on file  . Minutes of Exercise per Session: Not on file  Stress:   . Feeling of Stress : Not on file  Social Connections:   . Frequency of Communication with Friends and Family: Not on file  . Frequency of Social Gatherings with Friends and Family: Not on file  . Attends Religious Services: Not on file  . Active Member  of Clubs or Organizations: Not on file  . Attends Archivist Meetings: Not on file  . Marital Status: Not on file  Intimate Partner Violence:   . Fear of Current or Ex-Partner: Not on file  . Emotionally Abused: Not on file  . Physically Abused: Not on file  . Sexually Abused: Not on file     Review of Systems: General: negative for chills, fever, night sweats or weight changes.  Cardiovascular: negative for chest pain, dyspnea on exertion, edema, orthopnea, palpitations, paroxysmal nocturnal dyspnea or shortness of breath Dermatological: negative for rash Respiratory: negative for cough or wheezing Urologic: negative for hematuria Abdominal: negative for nausea, vomiting, diarrhea, bright red blood per rectum, melena, or hematemesis Neurologic: negative for visual changes, syncope, or dizziness All other systems reviewed and are otherwise negative except as noted above.    Blood pressure (!) 144/65, pulse 65, height 5' 6.5" (1.689 m), weight 169 lb 12.8 oz (77 kg), SpO2 94 %.  General appearance: alert and no distress Neck: no adenopathy, no carotid bruit, no JVD, supple, symmetrical, trachea midline and thyroid not enlarged, symmetric, no tenderness/mass/nodules Lungs: clear to auscultation bilaterally Heart: regular rate and rhythm, S1, S2 normal, no murmur, click, rub or gallop Extremities: extremities normal, atraumatic, no cyanosis or edema Pulses: 2+ and symmetric Skin: Skin color, texture, turgor normal. No rashes or lesions Neurologic: Alert and oriented X 3, normal strength and tone. Normal symmetric reflexes. Normal coordination and gait  EKG sinus rhythm at 65 with borderline LVH by voltage criteria. I personally reviewed this EKG.  ASSESSMENT AND PLAN:   Essential hypertension History of essential hypertension blood pressure measured today 144/65. She is on Toprol-XL 25 and valsartan 160.  Palpitations History of palpitations in the past which improved  with anxiety lytics and reducing caffeine intake. They have returned in the last several weeks. I did put her on a low-dose beta-blocker which helped as well. Going to increase her Toprol-XL from 25 to 50 mg a day. She will see an APP back in 3 months  Hyperlipidemia History of hyperlipidemia on atorvastatin with lipid profile performed 03/18/2020 revealing a total cholesterol 159, LDL of 88 and HDL 53.  Aortic insufficiency History of moderate aortic insufficiency with 2D echo performed 07/10/2020 revealing normal LV size and function, mild LVH with diastolic dysfunction grade 1 and moderate AI. She is completely asymptomatic from this. We will continue to follow this on annual basis.      Lorretta Harp MD FACP,FACC,FAHA, [facility] 10/04/2020 8:59 AM

## 2020-10-04 NOTE — Assessment & Plan Note (Signed)
History of essential hypertension blood pressure measured today 144/65. She is on Toprol-XL 25 and valsartan 160.

## 2020-10-04 NOTE — Assessment & Plan Note (Signed)
History of palpitations in the past which improved with anxiety lytics and reducing caffeine intake. They have returned in the last several weeks. I did put her on a low-dose beta-blocker which helped as well. Going to increase her Toprol-XL from 25 to 50 mg a day. She will see an APP back in 3 months

## 2020-10-04 NOTE — Assessment & Plan Note (Signed)
History of moderate aortic insufficiency with 2D echo performed 07/10/2020 revealing normal LV size and function, mild LVH with diastolic dysfunction grade 1 and moderate AI. She is completely asymptomatic from this. We will continue to follow this on annual basis.

## 2020-10-04 NOTE — Assessment & Plan Note (Signed)
History of hyperlipidemia on atorvastatin with lipid profile performed 03/18/2020 revealing a total cholesterol 159, LDL of 88 and HDL 53.

## 2020-10-04 NOTE — Patient Instructions (Signed)
Medication Instructions:   INCREASE METOPROLOL TO 50 MG ONCE DAILY= 2 OF THE 25 MG TABLETS ONCE DAILY  *If you need a refill on your cardiac medications before your next appointment, please call your pharmacy   Testing/Procedures:  Your physician has requested that you have an echocardiogram. Echocardiography is a painless test that uses sound waves to create images of your heart. It provides your doctor with information about the size and shape of your heart and how well your heart's chambers and valves are working. This procedure takes approximately one hour. There are no restrictions for this procedure.[place] July 2022     Follow-Up: At [facility], you and your health needs are our priority.  As part of our continuing mission to provide you with exceptional heart care, we have created designated Provider Care Teams.  These Care Teams include your primary Cardiologist (physician) and Advanced Practice Providers (APPs -  Physician Assistants and Nurse Practitioners) who all work together to provide you with the care you need, when you need it.  We recommend signing up for the patient portal called "MyChart".  Sign up information is provided on this After Visit Summary.  MyChart is used to connect with patients for Virtual Visits (Telemedicine).  Patients are able to view lab/test results, encounter notes, upcoming appointments, etc.  Non-urgent messages can be sent to your provider as well.   To learn more about what you can do with MyChart, go to [email or web address].    Your next appointment:   3 month(s)  The format for your next appointment:   In Person  Provider:   You will see one of the following Advanced Practice Providers on your designated Care Team:    Kerin Ransom, PA-C  [place], [place]  Coletta Memos, [place]  Then, Quay Burow, MD will plan to see you again in 12 month(s).

## 2020-10-14 DIAGNOSIS — W268XXA Contact with other sharp object(s), not elsewhere classified, initial encounter: Secondary | ICD-10-CM | POA: Diagnosis not present

## 2020-10-14 DIAGNOSIS — Z882 Allergy status to sulfonamides status: Secondary | ICD-10-CM | POA: Diagnosis not present

## 2020-10-14 DIAGNOSIS — S61210A Laceration without foreign body of right index finger without damage to nail, initial encounter: Secondary | ICD-10-CM | POA: Diagnosis not present

## 2020-10-14 DIAGNOSIS — Z23 Encounter for immunization: Secondary | ICD-10-CM | POA: Diagnosis not present

## 2020-10-29 DIAGNOSIS — Z23 Encounter for immunization: Secondary | ICD-10-CM | POA: Diagnosis not present

## 2020-11-14 DIAGNOSIS — Z23 Encounter for immunization: Secondary | ICD-10-CM | POA: Diagnosis not present

## 2020-12-27 ENCOUNTER — Other Ambulatory Visit: Payer: Self-pay | Admitting: Cardiovascular Disease

## 2021-01-06 ENCOUNTER — Encounter: Payer: Self-pay | Admitting: Cardiology

## 2021-01-06 ENCOUNTER — Other Ambulatory Visit: Payer: Self-pay

## 2021-01-06 ENCOUNTER — Ambulatory Visit (INDEPENDENT_AMBULATORY_CARE_PROVIDER_SITE_OTHER): Payer: Medicare Other | Admitting: Cardiology

## 2021-01-06 VITALS — BP 158/70 | HR 60 | Ht 66.5 in | Wt 173.2 lb

## 2021-01-06 DIAGNOSIS — I10 Essential (primary) hypertension: Secondary | ICD-10-CM | POA: Diagnosis not present

## 2021-01-06 DIAGNOSIS — I351 Nonrheumatic aortic (valve) insufficiency: Secondary | ICD-10-CM | POA: Diagnosis not present

## 2021-01-06 NOTE — Assessment & Plan Note (Signed)
Aortic insufficiency- moderate on echo Oct 2021

## 2021-01-06 NOTE — Assessment & Plan Note (Signed)
Repeat B/P by me was acceptable.  I considered adding amlodipine but after rechecking her blood pressure I decided to instruct her to take her valsartan in the morning and monitor her blood pressure at home.  We will do a virtual follow-up in a couple weeks to review her blood pressures.  I did order a BMP today as she is on an ARB and I have no recent labs available.

## 2021-01-06 NOTE — Patient Instructions (Signed)
Medication Instructions:   TAKE VALSARTAN IN THE MORNING  *If you need a refill on your cardiac medications before your next appointment, please call your pharmacy*   Lab Work:  Your physician recommends that you HAVE LAB WORK TODAY  If you have labs (blood work) drawn today and your tests are completely normal, you will receive your results only by: Marland Kitchen MyChart Message (if you have MyChart) OR . A paper copy in the mail If you have any lab test that is abnormal or we need to change your treatment, we will call you to review the results.      Follow-Up: At [facility], you and your health needs are our priority.  As part of our continuing mission to provide you with exceptional heart care, we have created designated Provider Care Teams.  These Care Teams include your primary Cardiologist (physician) and Advanced Practice Providers (APPs -  Physician Assistants and Nurse Practitioners) who all work together to provide you with the care you need, when you need it.  We recommend signing up for the patient portal called "MyChart".  Sign up information is provided on this After Visit Summary.  MyChart is used to connect with patients for Virtual Visits (Telemedicine).  Patients are able to view lab/test results, encounter notes, upcoming appointments, etc.  Non-urgent messages can be sent to your provider as well.   To learn more about what you can do with MyChart, go to [email or web address].    Your next appointment:   2 week(s)  The format for your next appointment:   Virtual Visit   Provider:   Kerin Ransom PA-C   Other Instructions CHECK BLOOD PRESSURE 3 X WEEKLY AND WRITE IT DOWN

## 2021-01-06 NOTE — Progress Notes (Signed)
Cardiology Office Note:    Date:  01/06/2021   ID:  [name], DOB [birth year], MRN [id number]  PCP:  Rosalee Kaufman, PA-C  Cardiologist:  Quay Burow, MD  Electrophysiologist:  None   Referring MD: Rosalee Kaufman, *   No chief complaint on file. CC: none  History of Present Illness:    Judy Mitchell is a pleasant 70 y.o. female with a hx of hypertension, AI, and palpitations.  She had been on a beta-blocker.  Dr. Gwenlyn Found increased this at his last office for increased symptoms.  The patient says that she is under a great deal of stress, apparently her husband is disabled and she cares for him at home.  She says she has not been able to exercise as she had been.  She has also gained some weight which worries her. She returns today for follow-up.  Since her beta-blocker was increased her symptoms have improved.  Her blood pressure initially was 158/70, repeat blood pressure by me was 130/80.  She takes her losartan in the late evening.  Past Medical History:  Diagnosis Date  . Aortic insufficiency    per ECHO 06-27-18 epic   . Family history of adverse reaction to anesthesia    nausea, vomitting  . Hiatal hernia 04/13/2017  . High blood pressure 02/23/2017  . HLD (hyperlipidemia)   . Palpitations   . PONV (postoperative nausea and vomiting)     Past Surgical History:  Procedure Laterality Date  . ABDOMINAL HYSTERECTOMY    . cataract surgery  Bilateral 10/2016  . CHOLECYSTECTOMY    . complete hysterectomy    . endometriosis surgery    . ESOPHAGOGASTRODUODENOSCOPY N/A 05/26/2017   Procedure: ESOPHAGOGASTRODUODENOSCOPY (EGD);  Surgeon: Rogene Houston, MD;  Location: [facility];  Service: Endoscopy;  Laterality: N/A;  2:10  . HIATAL HERNIA REPAIR N/A 10/03/2018   Procedure: LAPAROSCOPIC REPAIR OF HIATAL HERNIA WITH GASTROPEXY, UPPER ENDOSCOPY;  Surgeon: Johnathan Hausen, MD;  Location: [facility]S;  Service: General;  Laterality: N/A;  . KNEE SURGERY Right   .  TONSILLECTOMY      Current Medications: Current Meds  Medication Sig  . ALPRAZolam (XANAX) 0.25 MG tablet Take 0.125 mg by mouth 2 (two) times daily as needed for anxiety.   Marland Kitchen atorvastatin (LIPITOR) 20 MG tablet TAKE 1 TABLET BY MOUTH EVERY DAY  . FLUoxetine (PROZAC) 40 MG capsule Take 40 mg by mouth at bedtime.   . metoprolol succinate (TOPROL-XL) 50 MG 24 hr tablet Take 1 tablet (50 mg total) by mouth daily.  . valsartan (DIOVAN) 160 MG tablet Take 1 tablet (160 mg total) by mouth daily.     Allergies:   Sulfa antibiotics   Social History   Socioeconomic History  . Marital status: Married    Spouse name: Not on file  . Number of children: Not on file  . Years of education: Not on file  . Highest education level: Not on file  Occupational History  . Not on file  Tobacco Use  . Smoking status: Never Smoker  . Smokeless tobacco: Never Used  Vaping Use  . Vaping Use: Never used  Substance and Sexual Activity  . Alcohol use: Yes    Comment: rarely  . Drug use: No  . Sexual activity: Not on file  Other Topics Concern  . Not on file  Social History Narrative  . Not on file   Social Determinants of Health   Financial Resource Strain: Not on  file  Food Insecurity: Not on file  Transportation Needs: Not on file  Physical Activity: Not on file  Stress: Not on file  Social Connections: Not on file     Family History: The patient's family history includes Atrial fibrillation in her father; Heart attack in her sister; Parkinson's disease in her father, maternal grandmother, and paternal grandmother; Ulcers in her sister.  ROS:   Please see the history of present illness.     All other systems reviewed and are negative.  EKGs/Labs/Other Studies Reviewed:    The following studies were reviewed today:   EKG:  EKG is not ordered today.  The ekg ordered 10/04/2020 demonstrates NSR, inferior Qs, HR 65  Recent Labs: 03/18/2020: ALT 8  Recent Lipid Panel    Component  Value Date/Time   CHOL 159 03/18/2020 1001   TRIG 97 03/18/2020 1001   HDL 53 03/18/2020 1001   CHOLHDL 3.0 03/18/2020 1001   LDLCALC 88 03/18/2020 1001    Physical Exam:    VS:  BP (!) 158/70   Pulse 60   Ht 5' 6.5" (1.689 m)   Wt 173 lb 3.2 oz (78.6 kg)   BMI 27.54 kg/m     Wt Readings from Last 3 Encounters:  01/06/21 173 lb 3.2 oz (78.6 kg)  10/04/20 169 lb 12.8 oz (77 kg)  01/16/20 165 lb 6.4 oz (75 kg)     GEN: Well nourished, well developed female in no acute distress HEENT: Normal NECK: No JVD; No carotid bruits CARDIAC: RRR, 1/6 AI murmur, no rubs, gallops RESPIRATORY:  Clear to auscultation without rales, wheezing or rhonchi  ABDOMEN: Soft,  non-distended MUSCULOSKELETAL:  No edema; No deformity  SKIN: Warm and dry NEUROLOGIC:  Alert and oriented x 3 PSYCHIATRIC:  Normal affect   ASSESSMENT:    Essential hypertension Repeat B/P by me was acceptable.  I considered adding amlodipine but after rechecking her blood pressure I decided to instruct her to take her valsartan in the morning and monitor her blood pressure at home.  We will do a virtual follow-up in a couple weeks to review her blood pressures.  I did order a BMP today as she is on an ARB and I have no recent labs available.  Aortic insufficiency Aortic insufficiency- moderate on echo Oct 2021  PLAN:    Change valsartan to Q AM, monitor B/P at home.  Check BMP.  Virtual f/u 2-3 weeks.    Medication Adjustments/Labs and Tests Ordered: Current medicines are reviewed at length with the patient today.  Concerns regarding medicines are outlined above.  Orders Placed This Encounter  Procedures  . Basic Metabolic Panel (BMET)   No orders of the defined types were placed in this encounter.   Patient Instructions  Medication Instructions:   TAKE VALSARTAN IN THE MORNING  *If you need a refill on your cardiac medications before your next appointment, please call your pharmacy*   Lab Work:  Your  physician recommends that you HAVE LAB WORK TODAY  If you have labs (blood work) drawn today and your tests are completely normal, you will receive your results only by: Marland Kitchen MyChart Message (if you have MyChart) OR . A paper copy in the mail If you have any lab test that is abnormal or we need to change your treatment, we will call you to review the results.      Follow-Up: At [facility], you and your health needs are our priority.  As part of our continuing  mission to provide you with exceptional heart care, we have created designated Provider Care Teams.  These Care Teams include your primary Cardiologist (physician) and Advanced Practice Providers (APPs -  Physician Assistants and Nurse Practitioners) who all work together to provide you with the care you need, when you need it.  We recommend signing up for the patient portal called "MyChart".  Sign up information is provided on this After Visit Summary.  MyChart is used to connect with patients for Virtual Visits (Telemedicine).  Patients are able to view lab/test results, encounter notes, upcoming appointments, etc.  Non-urgent messages can be sent to your provider as well.   To learn more about what you can do with MyChart, go to [email or web address].    Your next appointment:   2 week(s)  The format for your next appointment:   Virtual Visit   Provider:   Kerin Ransom PA-C   Other Instructions CHECK BLOOD PRESSURE 3 X WEEKLY AND WRITE IT DOWN    Signed, Kerin Ransom, PA-C  01/06/2021 3:08 PM    [facility]

## 2021-01-07 LAB — BASIC METABOLIC PANEL
BUN/Creatinine Ratio: 15 (ref 12–28)
BUN: 12 mg/dL (ref 8–27)
CO2: 22 mmol/L (ref 20–29)
Calcium: 9.2 mg/dL (ref 8.7–10.3)
Chloride: 101 mmol/L (ref 96–106)
Creatinine, Ser: 0.8 mg/dL (ref 0.57–1.00)
GFR calc Af Amer: 86 mL/min/{1.73_m2} (ref >59)
GFR calc non Af Amer: 75 mL/min/{1.73_m2} (ref >59)
Glucose: 87 mg/dL (ref 65–99)
Potassium: 4.5 mmol/L (ref 3.5–5.2)
Sodium: 138 mmol/L (ref 134–144)

## 2021-01-20 ENCOUNTER — Encounter: Payer: Self-pay | Admitting: Cardiology

## 2021-01-20 ENCOUNTER — Telehealth (INDEPENDENT_AMBULATORY_CARE_PROVIDER_SITE_OTHER): Payer: Medicare Other | Admitting: Cardiology

## 2021-01-20 VITALS — BP 173/76 | HR 60 | Ht 66.5 in | Wt 170.0 lb

## 2021-01-20 DIAGNOSIS — I351 Nonrheumatic aortic (valve) insufficiency: Secondary | ICD-10-CM | POA: Diagnosis not present

## 2021-01-20 DIAGNOSIS — I10 Essential (primary) hypertension: Secondary | ICD-10-CM

## 2021-01-20 MED ORDER — AMLODIPINE BESYLATE 5 MG PO TABS: 5.0000 mg | ORAL_TABLET | Freq: Every day | ORAL | 3 refills | Status: DC

## 2021-01-20 NOTE — Progress Notes (Signed)
Virtual Visit via Telephone Note   This visit type was conducted due to national recommendations for restrictions regarding the COVID-19 Pandemic (e.g. social distancing) in an effort to limit this patient's exposure and mitigate transmission in our community.  Due to her co-morbid illnesses, this patient is at least at moderate risk for complications without adequate follow up.  This format is felt to be most appropriate for this patient at this time.  The patient did not have access to video technology/had technical difficulties with video requiring transitioning to audio format only (telephone).  All issues noted in this document were discussed and addressed.  No physical exam could be performed with this format.  Please refer to the patient's chart for her  consent to telehealth for [facility].    Date:  01/20/2021   ID:  Judy Mitchell, DOB June 12, 1951, MRN [id number] The patient was identified using 2 identifiers.  Patient Location: Home Provider Location: Home Office  PCP:  Rosalee Kaufman, PA-C  Cardiologist:  Quay Burow, MD  Electrophysiologist:  None   Evaluation Performed:  Follow-Up Visit  Chief Complaint:  none  History of Present Illness:    Judy Mitchell is a pleasant 70 y.o. female with a history  hypertension with mild LVH and grade 1 DD, moderate AI by echo July 2021, and palpitations. The patient says that she is under a great deal of stress, apparently her husband is disabled and she cares for him at home.  She says she has not been able to exercise as she had been.  She has also gained some weight which worries her. She returns today for follow-up.  Dr Gwenlyn Found had increased her beta-blocker in Oct 2021 and her symptoms of palpitations have improved.  I saw her in the office 01/06/2021.  Her B/P had been running high, especially noted mid day.  I suggested she take her Diovan in the morning and contacted her today for follow up.    She tells me her B/P is  better later in the day but in the mornings its always high- today 173/76 an hour after taking her medications.    The patient does not have symptoms concerning for COVID-19 infection (fever, chills, cough, or new shortness of breath).    Past Medical History:  Diagnosis Date  . Aortic insufficiency    per ECHO 06-27-18 epic   . Family history of adverse reaction to anesthesia    nausea, vomitting  . Hiatal hernia 04/13/2017  . High blood pressure 02/23/2017  . HLD (hyperlipidemia)   . Palpitations   . PONV (postoperative nausea and vomiting)    Past Surgical History:  Procedure Laterality Date  . ABDOMINAL HYSTERECTOMY    . cataract surgery  Bilateral 10/2016  . CHOLECYSTECTOMY    . complete hysterectomy    . endometriosis surgery    . ESOPHAGOGASTRODUODENOSCOPY N/A 05/26/2017   Procedure: ESOPHAGOGASTRODUODENOSCOPY (EGD);  Surgeon: Rogene Houston, MD;  Location: [facility];  Service: Endoscopy;  Laterality: N/A;  2:10  . HIATAL HERNIA REPAIR N/A 10/03/2018   Procedure: LAPAROSCOPIC REPAIR OF HIATAL HERNIA WITH GASTROPEXY, UPPER ENDOSCOPY;  Surgeon: Johnathan Hausen, MD;  Location: [facility];  Service: General;  Laterality: N/A;  . KNEE SURGERY Right   . TONSILLECTOMY       Current Meds  Medication Sig  . ALPRAZolam (XANAX) 0.25 MG tablet Take 0.125 mg by mouth 2 (two) times daily as needed for anxiety.   Marland Kitchen atorvastatin (LIPITOR) 20 MG  tablet TAKE 1 TABLET BY MOUTH EVERY DAY  . FLUoxetine (PROZAC) 40 MG capsule Take 40 mg by mouth at bedtime.   . metoprolol succinate (TOPROL-XL) 50 MG 24 hr tablet Take 1 tablet (50 mg total) by mouth daily.  . valsartan (DIOVAN) 160 MG tablet Take 1 tablet (160 mg total) by mouth daily.     Allergies:   Sulfa antibiotics   Social History   Tobacco Use  . Smoking status: Never Smoker  . Smokeless tobacco: Never Used  Vaping Use  . Vaping Use: Never used  Substance Use Topics  . Alcohol use: Yes    Comment: rarely  . Drug use: No      Family Hx: The patient's family history includes Atrial fibrillation in her father; Heart attack in her sister; Parkinson's disease in her father, maternal grandmother, and paternal grandmother; Ulcers in her sister.  ROS:   Please see the history of present illness.    All other systems reviewed and are negative.   Prior CV studies:   The following studies were reviewed today:  Echo July 2021- IMPRESSIONS    1. Left ventricular ejection fraction, by estimation, is 60 to 65%. The  left ventricle has normal function. The left ventricle has no regional  wall motion abnormalities. There is mild left ventricular hypertrophy.  Left ventricular diastolic parameters  are consistent with Grade I diastolic dysfunction (impaired relaxation).  2. Right ventricular systolic function is normal. The right ventricular  size is normal.  3. The mitral valve is normal in structure. No evidence of mitral valve  regurgitation. No evidence of mitral stenosis.  4. The aortic valve is abnormal. Aortic valve regurgitation is moderate.  Mild aortic valve sclerosis is present, with no evidence of aortic valve  stenosis.  5. The inferior vena cava is normal in size with greater than 50%  respiratory variability, suggesting right atrial pressure of 3 mmHg  Labs/Other Tests and Data Reviewed:    EKG:  An ECG dated 10/04/2020 was personally reviewed today and demonstrated:  NSR, HR 65  Recent Labs: 03/18/2020: ALT 8 01/06/2021: BUN 12; Creatinine, Ser 0.80; Potassium 4.5; Sodium 138   Recent Lipid Panel Lab Results  Component Value Date/Time   CHOL 159 03/18/2020 10:01 AM   TRIG 97 03/18/2020 10:01 AM   HDL 53 03/18/2020 10:01 AM   CHOLHDL 3.0 03/18/2020 10:01 AM   LDLCALC 88 03/18/2020 10:01 AM    Wt Readings from Last 3 Encounters:  01/20/21 170 lb (77.1 kg)  01/06/21 173 lb 3.2 oz (78.6 kg)  10/04/20 169 lb 12.8 oz (77 kg)     Risk Assessment/Calculations:      Objective:     Vital Signs:  BP (!) 173/76   Pulse 60   Ht 5' 6.5" (1.689 m)   Wt 170 lb (77.1 kg)   BMI 27.03 kg/m    VITAL SIGNS:  reviewed  ASSESSMENT & PLAN:    HTN- Morning HTN- she attributes this to anxiety. Either way its too high with her history of AI.  I suggested we add Amlodipine 5 mg Q HS.  OV with Dr Gwenlyn Found in 3 months.  Moderate AI- No c/o SOB     Plan: Amlodipine 5 mg Q HS.  OV with Dr Gwenlyn Found in 3 months.   COVID-19 Education: The signs and symptoms of COVID-19 were discussed with the patient and how to seek care for testing (follow up with PCP or arrange E-visit).  The importance of social  distancing was discussed today.  Time:   Today, I have spent 10 minutes with the patient with telehealth technology discussing the above problems.     Medication Adjustments/Labs and Tests Ordered: Current medicines are reviewed at length with the patient today.  Concerns regarding medicines are outlined above.   Tests Ordered: No orders of the defined types were placed in this encounter.   Medication Changes: No orders of the defined types were placed in this encounter.   Follow Up:  In Person 3 months  Signed, Kerin Ransom, Hershal Coria  01/20/2021 8:20 AM    [facility]

## 2021-01-20 NOTE — Patient Instructions (Signed)
Medication Instructions:  START- Amlodipine 5 mg by mouth daily  *If you need a refill on your cardiac medications before your next appointment, please call your pharmacy*   Lab Work: None Ordered   Testing/Procedures: None Ordered   Follow-Up: At [facility], you and your health needs are our priority.  As part of our continuing mission to provide you with exceptional heart care, we have created designated Provider Care Teams.  These Care Teams include your primary Cardiologist (physician) and Advanced Practice Providers (APPs -  Physician Assistants and Nurse Practitioners) who all work together to provide you with the care you need, when you need it.  We recommend signing up for the patient portal called "MyChart".  Sign up information is provided on this After Visit Summary.  MyChart is used to connect with patients for Virtual Visits (Telemedicine).  Patients are able to view lab/test results, encounter notes, upcoming appointments, etc.  Non-urgent messages can be sent to your provider as well.   To learn more about what you can do with MyChart, go to [email or web address].    Your next appointment:   Tuesday April 26th @ 9:00 am  The format for your next appointment:   In Person  Provider:   Quay Burow, MD

## 2021-02-04 ENCOUNTER — Telehealth: Payer: Self-pay | Admitting: Cardiovascular Disease

## 2021-02-04 NOTE — Telephone Encounter (Signed)
Returned call to patient no answer.Left message to call back. 

## 2021-02-04 NOTE — Telephone Encounter (Signed)
Pt c/o BP issue: STAT if pt c/o blurred vision, one-sided weakness or slurred speech  1. What are your last 5 BP readings?  This morning was 128/60 , yesterday it was 111/54,  02-01-21- 111/62,  01-31-21- 111/46, 01-30-21- 102/55   2. Are you having any other symptoms (ex. Dizziness, headache, blurred vision, passed out)? A little dizzy at firstFeet are swelling- wonder if she could cut her Amlodipine in half  3. What is your BP issue? Her bottom number is low

## 2021-02-05 ENCOUNTER — Telehealth: Payer: Self-pay | Admitting: Cardiovascular Disease

## 2021-02-05 NOTE — Telephone Encounter (Signed)
Patient reports that over the past week her systolic has not been over 116. She reports that it is usually around 110 so she is hoping to decrease her Amlodipine if possible. States she is lightheaded occasionally but the episodes are short and pass quickly.   Patient does not have HR readings, denies any other symptoms except for mild lower extremity swelling that she says goes away over night but increases again when she is on her feet throughout the day. Patient denies any additional sodium, denies weight gain or pain in feet.

## 2021-02-05 NOTE — Telephone Encounter (Signed)
Pt c/o medication issue:  1. Name of Medication: amLODipine (NORVASC) 5 MG tablet  2. How are you currently taking this medication (dosage and times per day)? As written  3. Are you having a reaction (difficulty breathing--STAT)? No   4. What is your medication issue? Patient blood pressure has been in the 110's (upper number) and wanted to know if she could cut it in half and take it that way first. Also mentioned about her feet being swollen.

## 2021-02-06 NOTE — Telephone Encounter (Signed)
She wasn't on amlodipine when I saw her in Oct.

## 2021-02-07 DIAGNOSIS — K219 Gastro-esophageal reflux disease without esophagitis: Secondary | ICD-10-CM | POA: Diagnosis not present

## 2021-02-07 DIAGNOSIS — I10 Essential (primary) hypertension: Secondary | ICD-10-CM | POA: Diagnosis not present

## 2021-02-07 DIAGNOSIS — E7849 Other hyperlipidemia: Secondary | ICD-10-CM | POA: Diagnosis not present

## 2021-02-07 DIAGNOSIS — Z1329 Encounter for screening for other suspected endocrine disorder: Secondary | ICD-10-CM | POA: Diagnosis not present

## 2021-02-07 DIAGNOSIS — E782 Mixed hyperlipidemia: Secondary | ICD-10-CM | POA: Diagnosis not present

## 2021-02-10 NOTE — Telephone Encounter (Signed)
110-116 is perfect.  If she is having symptoms of orthostatic dizziness she can try taking 2.5 mg Norvasc BID. Keep f/u with Dr Gwenlyn Found as scheduled.  Kerin Ransom PA-C 02/10/2021 9:20 AM

## 2021-02-10 NOTE — Telephone Encounter (Signed)
Spoke with patient's spouse Sallie Staron per DPR. Relayed message from [facility], [place]. Understanding verbalized, patient to call back with questions.

## 2021-02-12 DIAGNOSIS — J309 Allergic rhinitis, unspecified: Secondary | ICD-10-CM | POA: Diagnosis not present

## 2021-02-12 DIAGNOSIS — E7849 Other hyperlipidemia: Secondary | ICD-10-CM | POA: Diagnosis not present

## 2021-02-12 DIAGNOSIS — Z6828 Body mass index (BMI) 28.0-28.9, adult: Secondary | ICD-10-CM | POA: Diagnosis not present

## 2021-02-12 DIAGNOSIS — F419 Anxiety disorder, unspecified: Secondary | ICD-10-CM | POA: Diagnosis not present

## 2021-02-12 DIAGNOSIS — I10 Essential (primary) hypertension: Secondary | ICD-10-CM | POA: Diagnosis not present

## 2021-02-12 DIAGNOSIS — F329 Major depressive disorder, single episode, unspecified: Secondary | ICD-10-CM | POA: Diagnosis not present

## 2021-02-12 DIAGNOSIS — K219 Gastro-esophageal reflux disease without esophagitis: Secondary | ICD-10-CM | POA: Diagnosis not present

## 2021-02-17 ENCOUNTER — Telehealth: Payer: Self-pay | Admitting: Cardiovascular Disease

## 2021-02-17 DIAGNOSIS — L570 Actinic keratosis: Secondary | ICD-10-CM | POA: Diagnosis not present

## 2021-02-17 DIAGNOSIS — D485 Neoplasm of uncertain behavior of skin: Secondary | ICD-10-CM | POA: Diagnosis not present

## 2021-02-17 DIAGNOSIS — L814 Other melanin hyperpigmentation: Secondary | ICD-10-CM | POA: Diagnosis not present

## 2021-02-17 DIAGNOSIS — L821 Other seborrheic keratosis: Secondary | ICD-10-CM | POA: Diagnosis not present

## 2021-02-17 DIAGNOSIS — C44319 Basal cell carcinoma of skin of other parts of face: Secondary | ICD-10-CM | POA: Diagnosis not present

## 2021-02-17 DIAGNOSIS — L820 Inflamed seborrheic keratosis: Secondary | ICD-10-CM | POA: Diagnosis not present

## 2021-02-17 DIAGNOSIS — Z85828 Personal history of other malignant neoplasm of skin: Secondary | ICD-10-CM | POA: Diagnosis not present

## 2021-02-17 DIAGNOSIS — D225 Melanocytic nevi of trunk: Secondary | ICD-10-CM | POA: Diagnosis not present

## 2021-02-17 MED ORDER — VALSARTAN 160 MG PO TABS: 160.0000 mg | ORAL_TABLET | Freq: Every day | ORAL | 3 refills | Status: DC

## 2021-02-17 NOTE — Telephone Encounter (Signed)
*  STAT* If patient is at the pharmacy, call can be transferred to refill team.   1. Which medications need to be refilled? (please list name of each medication and dose if known) valsartan (DIOVAN) 160 MG tablet  2. Which pharmacy/location (including street and city if local pharmacy) is medication to be sent to? [facility] [address], [place]  3. Do they need a 30 day or 90 day supply? 90 day supply

## 2021-04-22 ENCOUNTER — Ambulatory Visit (INDEPENDENT_AMBULATORY_CARE_PROVIDER_SITE_OTHER): Payer: Medicare Other | Admitting: Cardiovascular Disease

## 2021-04-22 ENCOUNTER — Other Ambulatory Visit: Payer: Self-pay

## 2021-04-22 ENCOUNTER — Encounter: Payer: Self-pay | Admitting: Cardiovascular Disease

## 2021-04-22 DIAGNOSIS — E782 Mixed hyperlipidemia: Secondary | ICD-10-CM | POA: Diagnosis not present

## 2021-04-22 DIAGNOSIS — I10 Essential (primary) hypertension: Secondary | ICD-10-CM | POA: Diagnosis not present

## 2021-04-22 DIAGNOSIS — I351 Nonrheumatic aortic (valve) insufficiency: Secondary | ICD-10-CM | POA: Diagnosis not present

## 2021-04-22 DIAGNOSIS — R002 Palpitations: Secondary | ICD-10-CM | POA: Diagnosis not present

## 2021-04-22 MED ORDER — AMLODIPINE BESYLATE 2.5 MG PO TABS: 2.5000 mg | ORAL_TABLET | Freq: Every day | ORAL | 3 refills | Status: DC

## 2021-04-22 NOTE — Patient Instructions (Signed)

## 2021-04-22 NOTE — Assessment & Plan Note (Signed)
History of hyperlipidemia on atorvastatin 20 mg a day with lipid profile performed 02/07/2021 revealing total cholesterol 145, LDL 77 and HDL of 55.

## 2021-04-22 NOTE — Assessment & Plan Note (Signed)
History of moderate aortic insufficiency by 2D echo performed 07/10/2020 with normal LV size and function.  She is totally asymptomatic.  She has another echo scheduled for this July.

## 2021-04-22 NOTE — Progress Notes (Signed)
04/22/2021 Judy Mitchell   April 07, 1951  [id number]  Primary Physician Judy Kaufman, PA-C Primary Cardiologist: Judy Harp MD Judy Mitchell, [place], [place]  HPI:  Judy Mitchell is a 70 y.o.  Caucasian female no children who is retired from working at the [employer]. She was referred by Judy Krill, PA-C for evaluation of PACs.I last saw her in the office  10/04/2020.She has no prior cardiac history. Her only risk factors include treated hypertension. She is never had a heart attack or stroke. She denies chest pain or shortness of breath. She does have a hiatal hernia with GERD on omeprazole followed by Dr. Trilby Leaver Mitchell new onset palpitations approximately 6 months ago occurring in the early morning hours awaking feeling her from sleep frequently at around 4 AM.There are no other associated symptoms. They are self-limited. She has limited caffeine and chocolate from her diet. Her thyroid function tests have been normal.  Shewasunder a lot of stress at home with an ailing husband. Her palpitations have improved with the addition of Prozac, Xanax and elimination of caffeine. A 2D echo was performed that showed normal LV systolic function, mild LV dilatation with mild to moderate AI and mild to moderate pulmonary hypertension.She underwent successful laparoscopic Nissen fundoplication by Dr. Kaylyn Mitchell which resulted in improvement in her reflux symptoms.   She unfortunately lost her mother this past summer for metastatic melanoma. She describes her as "her best friend". She has noticed increased frequency and severity of palpitations in the last several weeks without identifiable cause. These had improved with abstaining from caffeine intake, anxiety lytics and low-dose beta-blocker.  Since I saw her 6 months ago she continues to do well.  She no longer has palpitations.  She denies chest pain or shortness of breath.  Blood pressures under  better control after the addition of amlodipine 5 mg which she cut in half because of hypotension.  Her 2D echocardiogram performed in July of last year showed normal LV size and function with moderate aortic insufficiency.   Current Meds  Medication Sig  . ALPRAZolam (XANAX) 0.25 MG tablet Take 0.125 mg by mouth 2 (two) times daily as needed for anxiety.   Marland Kitchen atorvastatin (LIPITOR) 20 MG tablet TAKE 1 TABLET BY MOUTH EVERY DAY  . FLUoxetine (PROZAC) 40 MG capsule Take 40 mg by mouth at bedtime.   . metoprolol succinate (TOPROL-XL) 50 MG 24 hr tablet Take 1 tablet (50 mg total) by mouth daily.  . valsartan (DIOVAN) 160 MG tablet Take 1 tablet (160 mg total) by mouth daily.     Allergies  Allergen Reactions  . Sulfa Antibiotics Itching and Rash    Social History   Socioeconomic History  . Marital status: Married    Spouse name: Not on file  . Number of children: Not on file  . Years of education: Not on file  . Highest education level: Not on file  Occupational History  . Not on file  Tobacco Use  . Smoking status: Never Smoker  . Smokeless tobacco: Never Used  Vaping Use  . Vaping Use: Never used  Substance and Sexual Activity  . Alcohol use: Yes    Comment: rarely  . Drug use: No  . Sexual activity: Not on file  Other Topics Concern  . Not on file  Social History Narrative  . Not on file   Social Determinants of Health   Financial Resource Strain: Not on file  Food Insecurity: Not  on file  Transportation Needs: Not on file  Physical Activity: Not on file  Stress: Not on file  Social Connections: Not on file  Intimate Partner Violence: Not on file     Review of Systems: General: negative for chills, fever, night sweats or weight changes.  Cardiovascular: negative for chest pain, dyspnea on exertion, edema, orthopnea, palpitations, paroxysmal nocturnal dyspnea or shortness of breath Dermatological: negative for rash Respiratory: negative for cough or  wheezing Urologic: negative for hematuria Abdominal: negative for nausea, vomiting, diarrhea, bright red blood per rectum, melena, or hematemesis Neurologic: negative for visual changes, syncope, or dizziness All other systems reviewed and are otherwise negative except as noted above.    Blood pressure 130/77, pulse 60, height 5\' 6"  (1.676 m), weight 175 lb 12.8 oz (79.7 kg), SpO2 97 %.  General appearance: alert and no distress Neck: no adenopathy, no carotid bruit, no JVD, supple, symmetrical, trachea midline and thyroid not enlarged, symmetric, no tenderness/mass/nodules Lungs: clear to auscultation bilaterally Heart: regular rate and rhythm, S1, S2 normal, no murmur, click, rub or gallop Extremities: extremities normal, atraumatic, no cyanosis or edema Pulses: 2+ and symmetric Skin: Skin color, texture, turgor normal. No rashes or lesions Neurologic: Alert and oriented X 3, normal strength and tone. Normal symmetric reflexes. Normal coordination and gait  EKG sinus rhythm at 60 with LVH voltage and reverse R wave progression along with Q waves in leads III and F.  I personally reviewed this EKG.  ASSESSMENT AND PLAN:   Essential hypertension History of essential hypertension a blood pressure measured today at 130/77.  She is on Diovan, amlodipine and metoprolol.  Her blood pressure is much better controlled at home.  Palpitations History of palpitations in the past with event monitor performed several years ago that only showed sinus rhythm.  She was under a lot of stress with her ailing husband the loss of her mother.  She did abstain from caffeine and improved dramatically after addition of low-dose beta-blocker.  Hyperlipidemia History of hyperlipidemia on atorvastatin 20 mg a day with lipid profile performed 02/07/2021 revealing total cholesterol 145, LDL 77 and HDL of 55.  Aortic insufficiency History of moderate aortic insufficiency by 2D echo performed 07/10/2020 with normal  LV size and function.  She is totally asymptomatic.  She has another echo scheduled for this July.      Judy Harp MD FACP,FACC,FAHA, [facility] 04/22/2021 9:27 AM

## 2021-04-22 NOTE — Assessment & Plan Note (Signed)
History of palpitations in the past with event monitor performed several years ago that only showed sinus rhythm.  She was under a lot of stress with her ailing husband the loss of her mother.  She did abstain from caffeine and improved dramatically after addition of low-dose beta-blocker.

## 2021-04-22 NOTE — Assessment & Plan Note (Signed)
History of essential hypertension a blood pressure measured today at 130/77.  She is on Diovan, amlodipine and metoprolol.  Her blood pressure is much better controlled at home.

## 2021-05-09 DIAGNOSIS — Z23 Encounter for immunization: Secondary | ICD-10-CM | POA: Diagnosis not present

## 2021-07-02 ENCOUNTER — Ambulatory Visit (HOSPITAL_COMMUNITY): Payer: Medicare Other | Attending: Interventional Cardiology

## 2021-07-02 ENCOUNTER — Other Ambulatory Visit: Payer: Self-pay

## 2021-07-02 DIAGNOSIS — I351 Nonrheumatic aortic (valve) insufficiency: Secondary | ICD-10-CM | POA: Diagnosis not present

## 2021-07-02 LAB — ECHOCARDIOGRAM COMPLETE
Area-P 1/2: 3.01 cm2
S' Lateral: 2.9 cm

## 2021-08-11 DIAGNOSIS — E7849 Other hyperlipidemia: Secondary | ICD-10-CM | POA: Diagnosis not present

## 2021-08-11 DIAGNOSIS — E782 Mixed hyperlipidemia: Secondary | ICD-10-CM | POA: Diagnosis not present

## 2021-08-11 DIAGNOSIS — K219 Gastro-esophageal reflux disease without esophagitis: Secondary | ICD-10-CM | POA: Diagnosis not present

## 2021-08-11 DIAGNOSIS — Z1329 Encounter for screening for other suspected endocrine disorder: Secondary | ICD-10-CM | POA: Diagnosis not present

## 2021-08-11 DIAGNOSIS — I10 Essential (primary) hypertension: Secondary | ICD-10-CM | POA: Diagnosis not present

## 2021-08-14 DIAGNOSIS — Z1389 Encounter for screening for other disorder: Secondary | ICD-10-CM | POA: Diagnosis not present

## 2021-08-14 DIAGNOSIS — Z6828 Body mass index (BMI) 28.0-28.9, adult: Secondary | ICD-10-CM | POA: Diagnosis not present

## 2021-08-14 DIAGNOSIS — I10 Essential (primary) hypertension: Secondary | ICD-10-CM | POA: Diagnosis not present

## 2021-08-14 DIAGNOSIS — F329 Major depressive disorder, single episode, unspecified: Secondary | ICD-10-CM | POA: Diagnosis not present

## 2021-08-14 DIAGNOSIS — K219 Gastro-esophageal reflux disease without esophagitis: Secondary | ICD-10-CM | POA: Diagnosis not present

## 2021-08-14 DIAGNOSIS — Z1331 Encounter for screening for depression: Secondary | ICD-10-CM | POA: Diagnosis not present

## 2021-08-14 DIAGNOSIS — F419 Anxiety disorder, unspecified: Secondary | ICD-10-CM | POA: Diagnosis not present

## 2021-08-14 DIAGNOSIS — E7849 Other hyperlipidemia: Secondary | ICD-10-CM | POA: Diagnosis not present

## 2021-09-12 DIAGNOSIS — L0100 Impetigo, unspecified: Secondary | ICD-10-CM | POA: Diagnosis not present

## 2021-09-12 DIAGNOSIS — B001 Herpesviral vesicular dermatitis: Secondary | ICD-10-CM | POA: Diagnosis not present

## 2021-09-12 DIAGNOSIS — Z6828 Body mass index (BMI) 28.0-28.9, adult: Secondary | ICD-10-CM | POA: Diagnosis not present

## 2021-09-18 DIAGNOSIS — Z6828 Body mass index (BMI) 28.0-28.9, adult: Secondary | ICD-10-CM | POA: Diagnosis not present

## 2021-09-18 DIAGNOSIS — L0100 Impetigo, unspecified: Secondary | ICD-10-CM | POA: Diagnosis not present

## 2021-09-18 DIAGNOSIS — B373 Candidiasis of vulva and vagina: Secondary | ICD-10-CM | POA: Diagnosis not present

## 2021-09-18 DIAGNOSIS — B001 Herpesviral vesicular dermatitis: Secondary | ICD-10-CM | POA: Diagnosis not present

## 2021-10-03 DIAGNOSIS — Z23 Encounter for immunization: Secondary | ICD-10-CM | POA: Diagnosis not present

## 2021-10-20 DIAGNOSIS — Z23 Encounter for immunization: Secondary | ICD-10-CM | POA: Diagnosis not present

## 2021-10-30 ENCOUNTER — Other Ambulatory Visit: Payer: Self-pay | Admitting: Cardiovascular Disease

## 2021-10-30 DIAGNOSIS — R002 Palpitations: Secondary | ICD-10-CM

## 2022-01-05 DIAGNOSIS — I10 Essential (primary) hypertension: Secondary | ICD-10-CM | POA: Diagnosis not present

## 2022-01-05 DIAGNOSIS — E782 Mixed hyperlipidemia: Secondary | ICD-10-CM | POA: Diagnosis not present

## 2022-01-05 DIAGNOSIS — E7849 Other hyperlipidemia: Secondary | ICD-10-CM | POA: Diagnosis not present

## 2022-01-05 DIAGNOSIS — Z1329 Encounter for screening for other suspected endocrine disorder: Secondary | ICD-10-CM | POA: Diagnosis not present

## 2022-01-05 DIAGNOSIS — K219 Gastro-esophageal reflux disease without esophagitis: Secondary | ICD-10-CM | POA: Diagnosis not present

## 2022-01-07 DIAGNOSIS — K219 Gastro-esophageal reflux disease without esophagitis: Secondary | ICD-10-CM | POA: Diagnosis not present

## 2022-01-07 DIAGNOSIS — E7849 Other hyperlipidemia: Secondary | ICD-10-CM | POA: Diagnosis not present

## 2022-01-07 DIAGNOSIS — F419 Anxiety disorder, unspecified: Secondary | ICD-10-CM | POA: Diagnosis not present

## 2022-01-07 DIAGNOSIS — J309 Allergic rhinitis, unspecified: Secondary | ICD-10-CM | POA: Diagnosis not present

## 2022-01-07 DIAGNOSIS — F329 Major depressive disorder, single episode, unspecified: Secondary | ICD-10-CM | POA: Diagnosis not present

## 2022-01-07 DIAGNOSIS — I10 Essential (primary) hypertension: Secondary | ICD-10-CM | POA: Diagnosis not present

## 2022-01-07 DIAGNOSIS — Z6828 Body mass index (BMI) 28.0-28.9, adult: Secondary | ICD-10-CM | POA: Diagnosis not present

## 2022-01-07 DIAGNOSIS — R059 Cough, unspecified: Secondary | ICD-10-CM | POA: Diagnosis not present

## 2022-02-05 ENCOUNTER — Other Ambulatory Visit: Payer: Self-pay

## 2022-02-05 ENCOUNTER — Telehealth: Payer: Self-pay | Admitting: Cardiovascular Disease

## 2022-02-05 DIAGNOSIS — I351 Nonrheumatic aortic (valve) insufficiency: Secondary | ICD-10-CM

## 2022-02-05 NOTE — Telephone Encounter (Signed)
I reviewed last ECHO on file from 06/2021- patient is needing repeat in 12 months.  I placed ECHO order.   Scheduling please scheduled ECHO for around 06/2022   Thank you.   Will send to primary RN to make aware.

## 2022-02-05 NOTE — Telephone Encounter (Signed)
° °  Pt is calling wanted to get her yearly echo, no order on file yet

## 2022-02-12 DIAGNOSIS — Z1231 Encounter for screening mammogram for malignant neoplasm of breast: Secondary | ICD-10-CM | POA: Diagnosis not present

## 2022-02-16 DIAGNOSIS — C44619 Basal cell carcinoma of skin of left upper limb, including shoulder: Secondary | ICD-10-CM | POA: Diagnosis not present

## 2022-02-16 DIAGNOSIS — C44629 Squamous cell carcinoma of skin of left upper limb, including shoulder: Secondary | ICD-10-CM | POA: Diagnosis not present

## 2022-02-16 DIAGNOSIS — Z85828 Personal history of other malignant neoplasm of skin: Secondary | ICD-10-CM | POA: Diagnosis not present

## 2022-02-16 DIAGNOSIS — D045 Carcinoma in situ of skin of trunk: Secondary | ICD-10-CM | POA: Diagnosis not present

## 2022-02-16 DIAGNOSIS — D485 Neoplasm of uncertain behavior of skin: Secondary | ICD-10-CM | POA: Diagnosis not present

## 2022-02-16 DIAGNOSIS — C44319 Basal cell carcinoma of skin of other parts of face: Secondary | ICD-10-CM | POA: Diagnosis not present

## 2022-02-16 DIAGNOSIS — C44519 Basal cell carcinoma of skin of other part of trunk: Secondary | ICD-10-CM | POA: Diagnosis not present

## 2022-02-16 DIAGNOSIS — D225 Melanocytic nevi of trunk: Secondary | ICD-10-CM | POA: Diagnosis not present

## 2022-02-18 ENCOUNTER — Telehealth: Payer: Self-pay | Admitting: Cardiovascular Disease

## 2022-02-18 MED ORDER — VALSARTAN 160 MG PO TABS: 160.0000 mg | ORAL_TABLET | Freq: Every day | ORAL | 1 refills | Status: DC

## 2022-02-18 NOTE — Telephone Encounter (Signed)
Refills has been sent to the pharmacy. 

## 2022-02-18 NOTE — Telephone Encounter (Signed)
Refill sent to the [facility].

## 2022-02-18 NOTE — Addendum Note (Signed)
Addended by: Venetia Maxon on: 02/18/2022 01:55 PM   Modules accepted: Orders

## 2022-02-18 NOTE — Telephone Encounter (Signed)
°*  STAT* If patient is at the pharmacy, call can be transferred to refill team.   1. Which medications need to be refilled? (please list name of each medication and dose if known)  valsartan (DIOVAN) 160 MG tablet  2. Which pharmacy/location (including street and city if local pharmacy) is medication to be sent to? [id number] - [place], [place]  3. Do they need a 30 day or 90 day supply? 90 with refills  Patient is not scheduled  to see Dr. Gwenlyn Found until 07/10/22

## 2022-02-24 ENCOUNTER — Telehealth: Payer: Self-pay | Admitting: Cardiovascular Disease

## 2022-02-24 NOTE — Telephone Encounter (Signed)
° °*  STAT* If patient is at the pharmacy, call can be transferred to refill team.   1. Which medications need to be refilled? (please list name of each medication and dose if known) valsartan (DIOVAN) 160 MG tablet  2. Which pharmacy/location (including street and city if local pharmacy) is medication to be sent to? [facility], [address] [place], [place] [address] Phone: [phone]  3. Do they need a 30 day or 90 day supply? 1 week supply  Pt is still at [facility], she was supposed to be home but something happen to her house there that she needed to stay for a few days, she needs emergency refill because she is completely out of meds

## 2022-02-25 ENCOUNTER — Other Ambulatory Visit: Payer: Self-pay

## 2022-02-25 MED ORDER — VALSARTAN 160 MG PO TABS: 160.0000 mg | ORAL_TABLET | Freq: Every day | ORAL | 0 refills | Status: DC

## 2022-02-25 NOTE — Telephone Encounter (Signed)
Patient calling back for refill. She is out of town and needs [facility] to [facility]. ?

## 2022-02-25 NOTE — Telephone Encounter (Signed)
I attempted to contact patient to notify that medication was sent to pharmacy below.  ?Number is out of service.  ? ?RX sent.  ?

## 2022-03-03 ENCOUNTER — Other Ambulatory Visit: Payer: Self-pay

## 2022-03-09 ENCOUNTER — Other Ambulatory Visit: Payer: Self-pay

## 2022-03-09 MED ORDER — ATORVASTATIN CALCIUM 20 MG PO TABS: 20.0000 mg | ORAL_TABLET | Freq: Every day | ORAL | 1 refills | Status: DC

## 2022-03-25 ENCOUNTER — Ambulatory Visit: Payer: Self-pay

## 2022-03-25 ENCOUNTER — Encounter: Payer: Self-pay | Admitting: Orthopaedic Surgery

## 2022-03-25 ENCOUNTER — Ambulatory Visit (INDEPENDENT_AMBULATORY_CARE_PROVIDER_SITE_OTHER): Payer: Medicare Other | Admitting: Orthopaedic Surgery

## 2022-03-25 ENCOUNTER — Other Ambulatory Visit: Payer: Self-pay

## 2022-03-25 VITALS — Ht 66.0 in | Wt 180.0 lb

## 2022-03-25 DIAGNOSIS — G8929 Other chronic pain: Secondary | ICD-10-CM

## 2022-03-25 DIAGNOSIS — M25561 Pain in right knee: Secondary | ICD-10-CM

## 2022-03-25 NOTE — Progress Notes (Signed)
? ?Subjective:  ? ? Patient ID: Judy Mitchell, female    DOB: 11/24/1951, 71 y.o.   MRN: [id number] ? ?HPI ?She has long history of right knee pain.  I saw her in 2018 for this.  She has pain that comes and goes but over the last year it has persisted.  She has medial pain and posterior pain of the right knee.  She has swelling and popping but no giving way.  She has no trauma, no redness.  She has tried Tylenol, ice, heat and rubs with slight help.  I had done an injection years ago which helped. She is interested in trying that again. ? ? ?Review of Systems  ?Constitutional:  Positive for activity change.  ?Cardiovascular:  Positive for palpitations.  ?Musculoskeletal:  Positive for arthralgias, gait problem and joint swelling.  ?All other systems reviewed and are negative. ?For Review of Systems, all other systems reviewed and are negative. ? ?The following is a summary of the past history medically, past history surgically, known current medicines, social history and family history.  This information is gathered electronically by the computer from prior information and documentation.  I review this each visit and have found including this information at this point in the chart is beneficial and informative.  ? ?Past Medical History:  ?Diagnosis Date  ? Aortic insufficiency   ? per ECHO 06-27-18 epic   ? Family history of adverse reaction to anesthesia   ? nausea, vomitting  ? Hiatal hernia 04/13/2017  ? High blood pressure 02/23/2017  ? HLD (hyperlipidemia)   ? Palpitations   ? PONV (postoperative nausea and vomiting)   ? ? ?Past Surgical History:  ?Procedure Laterality Date  ? ABDOMINAL HYSTERECTOMY    ? cataract surgery  Bilateral 10/2016  ? CHOLECYSTECTOMY    ? complete hysterectomy    ? endometriosis surgery    ? ESOPHAGOGASTRODUODENOSCOPY N/A 05/26/2017  ? Procedure: ESOPHAGOGASTRODUODENOSCOPY (EGD);  Surgeon: Rogene Houston, MD;  Location: [facility];  Service: Endoscopy;  Laterality: N/A;  2:10  ? HIATAL  HERNIA REPAIR N/A 10/03/2018  ? Procedure: LAPAROSCOPIC REPAIR OF HIATAL HERNIA WITH GASTROPEXY, UPPER ENDOSCOPY;  Surgeon: Johnathan Hausen, MD;  Location: [facility];  Service: General;  Laterality: N/A;  ? KNEE SURGERY Right   ? TONSILLECTOMY    ? ? ?Current Outpatient Medications on File Prior to Visit  ?Medication Sig Dispense Refill  ? ALPRAZolam (XANAX) 0.25 MG tablet Take 0.125 mg by mouth 2 (two) times daily as needed for anxiety.     ? atorvastatin (LIPITOR) 20 MG tablet Take 1 tablet (20 mg total) by mouth daily. 90 tablet 1  ? FLUoxetine (PROZAC) 40 MG capsule Take 40 mg by mouth at bedtime.   2  ? metoprolol succinate (TOPROL-XL) 50 MG 24 hr tablet TAKE 1 TABLET BY MOUTH EVERY DAY 90 tablet 3  ? valsartan (DIOVAN) 160 MG tablet Take 1 tablet (160 mg total) by mouth daily. KEEP OV. 7 tablet 0  ? amLODipine (NORVASC) 2.5 MG tablet Take 1 tablet (2.5 mg total) by mouth daily. 90 tablet 3  ? ?No current facility-administered medications on file prior to visit.  ? ? ?Social History  ? ?Socioeconomic History  ? Marital status: Married  ?  Spouse name: Not on file  ? Number of children: Not on file  ? Years of education: Not on file  ? Highest education level: Not on file  ?Occupational History  ? Not on file  ?Tobacco Use  ?  Smoking status: Never  ? Smokeless tobacco: Never  ?Vaping Use  ? Vaping Use: Never used  ?Substance and Sexual Activity  ? Alcohol use: Yes  ?  Comment: rarely  ? Drug use: No  ? Sexual activity: Not on file  ?Other Topics Concern  ? Not on file  ?Social History Narrative  ? Not on file  ? ?Social Determinants of Health  ? ?Financial Resource Strain: Not on file  ?Food Insecurity: Not on file  ?Transportation Needs: Not on file  ?Physical Activity: Not on file  ?Stress: Not on file  ?Social Connections: Not on file  ?Intimate Partner Violence: Not on file  ? ? ?Family History  ?Problem Relation Age of Onset  ? Parkinson's disease Father   ? Atrial fibrillation Father   ? Ulcers Sister   ?  Heart attack Sister   ? Parkinson's disease Maternal Grandmother   ? Parkinson's disease Paternal Grandmother   ? ? ?Ht '5\' 6"'$  (1.676 m)   Wt 180 lb (81.6 kg)   BMI 29.05 kg/m?  ? ?Body mass index is 29.05 kg/m?. ? ?   ?Objective:  ? Physical Exam ?Vitals and nursing note reviewed. Exam conducted with a chaperone present.  ?Constitutional:   ?   Appearance: She is well-developed.  ?HENT:  ?   Head: Normocephalic and atraumatic.  ?Eyes:  ?   Conjunctiva/sclera: Conjunctivae normal.  ?   Pupils: Pupils are equal, round, and reactive to light.  ?Cardiovascular:  ?   Rate and Rhythm: Normal rate and regular rhythm.  ?Pulmonary:  ?   Effort: Pulmonary effort is normal.  ?Abdominal:  ?   Palpations: Abdomen is soft.  ?Musculoskeletal:  ?   Cervical back: Normal range of motion and neck supple.  ?     Legs: ? ?Skin: ?   General: Skin is warm and dry.  ?Neurological:  ?   Mental Status: She is alert and oriented to person, place, and time.  ?   Cranial Nerves: No cranial nerve deficit.  ?   Motor: No abnormal muscle tone.  ?   Coordination: Coordination normal.  ?   Deep Tendon Reflexes: Reflexes are normal and symmetric. Reflexes normal.  ?Psychiatric:     ?   Behavior: Behavior normal.     ?   Thought Content: Thought content normal.     ?   Judgment: Judgment normal.  ?X-rays were done of the right knee, reported separately. ? ? ? ? ?   ?Assessment & Plan:  ? ?Encounter Diagnosis  ?Name Primary?  ? Chronic pain of right knee Yes  ? ?PROCEDURE NOTE: ? ?The patient requests injections of the right knee , verbal consent was obtained. ? ?The right knee was prepped appropriately after time out was performed.  ? ?Sterile technique was observed and injection of 1 cc of DepoMedrol '40mg'$  with several cc's of plain xylocaine. Anesthesia was provided by ethyl chloride and a 20-gauge needle was used to inject the knee area. The injection was tolerated well.  A band aid dressing was applied. ? ?The patient was advised to apply ice  later today and tomorrow to the injection sight as needed. ? ?She has questions about peripheral neuropathy.  I have told her to talk to her family doctor.  I have told her about alpha lipoic acid tablets also. ? ?Return in one month. ? ?Call if any problem. ? ?Precautions discussed. ? ?Electronically Signed ?Sanjuana Kava, MD ?3/29/20239:15 AM ? ?

## 2022-04-19 ENCOUNTER — Other Ambulatory Visit: Payer: Self-pay | Admitting: Cardiovascular Disease

## 2022-04-19 DIAGNOSIS — R002 Palpitations: Secondary | ICD-10-CM

## 2022-04-22 ENCOUNTER — Encounter: Payer: Self-pay | Admitting: Orthopaedic Surgery

## 2022-04-22 ENCOUNTER — Ambulatory Visit (INDEPENDENT_AMBULATORY_CARE_PROVIDER_SITE_OTHER): Payer: Medicare Other | Admitting: Orthopaedic Surgery

## 2022-04-22 VITALS — Ht 66.0 in | Wt 175.0 lb

## 2022-04-22 DIAGNOSIS — M25561 Pain in right knee: Secondary | ICD-10-CM

## 2022-04-22 DIAGNOSIS — G8929 Other chronic pain: Secondary | ICD-10-CM

## 2022-04-22 NOTE — Progress Notes (Signed)
I am much better. ? ?Her right knee is much improved after the injection last time.  She is walking well and has little swelling. ? ?ROM of right knee is full, crepitus is present but it is stable.  NV intact. ? ?Encounter Diagnosis  ?Name Primary?  ? Chronic pain of right knee Yes  ? ?I will see prn. ? ?Call if any problem. ? ?Precautions discussed. ? ?Electronically Signed ?Sanjuana Kava, MD ?4/26/20239:50 AM ? ?

## 2022-06-12 DIAGNOSIS — Z882 Allergy status to sulfonamides status: Secondary | ICD-10-CM | POA: Diagnosis not present

## 2022-06-12 DIAGNOSIS — S42302A Unspecified fracture of shaft of humerus, left arm, initial encounter for closed fracture: Secondary | ICD-10-CM | POA: Diagnosis not present

## 2022-06-12 DIAGNOSIS — M79602 Pain in left arm: Secondary | ICD-10-CM | POA: Diagnosis not present

## 2022-06-12 DIAGNOSIS — W109XXA Fall (on) (from) unspecified stairs and steps, initial encounter: Secondary | ICD-10-CM | POA: Diagnosis not present

## 2022-06-12 DIAGNOSIS — Z79899 Other long term (current) drug therapy: Secondary | ICD-10-CM | POA: Diagnosis not present

## 2022-06-12 DIAGNOSIS — S42212A Unspecified displaced fracture of surgical neck of left humerus, initial encounter for closed fracture: Secondary | ICD-10-CM | POA: Diagnosis not present

## 2022-06-12 DIAGNOSIS — I10 Essential (primary) hypertension: Secondary | ICD-10-CM | POA: Diagnosis not present

## 2022-06-19 DIAGNOSIS — W108XXA Fall (on) (from) other stairs and steps, initial encounter: Secondary | ICD-10-CM | POA: Diagnosis not present

## 2022-06-19 DIAGNOSIS — S42212A Unspecified displaced fracture of surgical neck of left humerus, initial encounter for closed fracture: Secondary | ICD-10-CM | POA: Diagnosis not present

## 2022-06-19 DIAGNOSIS — M25512 Pain in left shoulder: Secondary | ICD-10-CM | POA: Diagnosis not present

## 2022-07-03 DIAGNOSIS — M25512 Pain in left shoulder: Secondary | ICD-10-CM | POA: Diagnosis not present

## 2022-07-06 DIAGNOSIS — Z1329 Encounter for screening for other suspected endocrine disorder: Secondary | ICD-10-CM | POA: Diagnosis not present

## 2022-07-06 DIAGNOSIS — I10 Essential (primary) hypertension: Secondary | ICD-10-CM | POA: Diagnosis not present

## 2022-07-06 DIAGNOSIS — K219 Gastro-esophageal reflux disease without esophagitis: Secondary | ICD-10-CM | POA: Diagnosis not present

## 2022-07-06 DIAGNOSIS — E7849 Other hyperlipidemia: Secondary | ICD-10-CM | POA: Diagnosis not present

## 2022-07-08 ENCOUNTER — Other Ambulatory Visit (HOSPITAL_COMMUNITY): Payer: Medicare Other

## 2022-07-10 ENCOUNTER — Other Ambulatory Visit: Payer: Self-pay

## 2022-07-10 ENCOUNTER — Ambulatory Visit: Payer: Medicare Other | Admitting: Cardiovascular Disease

## 2022-07-10 MED ORDER — AMLODIPINE BESYLATE 2.5 MG PO TABS: 2.5000 mg | ORAL_TABLET | Freq: Every day | ORAL | 0 refills | Status: DC

## 2022-07-13 DIAGNOSIS — R29898 Other symptoms and signs involving the musculoskeletal system: Secondary | ICD-10-CM | POA: Diagnosis not present

## 2022-07-13 DIAGNOSIS — M25512 Pain in left shoulder: Secondary | ICD-10-CM | POA: Diagnosis not present

## 2022-07-13 DIAGNOSIS — M25611 Stiffness of right shoulder, not elsewhere classified: Secondary | ICD-10-CM | POA: Diagnosis not present

## 2022-07-16 DIAGNOSIS — R29898 Other symptoms and signs involving the musculoskeletal system: Secondary | ICD-10-CM | POA: Diagnosis not present

## 2022-07-16 DIAGNOSIS — M25611 Stiffness of right shoulder, not elsewhere classified: Secondary | ICD-10-CM | POA: Diagnosis not present

## 2022-07-16 DIAGNOSIS — M25512 Pain in left shoulder: Secondary | ICD-10-CM | POA: Diagnosis not present

## 2022-07-17 DIAGNOSIS — I809 Phlebitis and thrombophlebitis of unspecified site: Secondary | ICD-10-CM | POA: Diagnosis not present

## 2022-07-17 DIAGNOSIS — Z6830 Body mass index (BMI) 30.0-30.9, adult: Secondary | ICD-10-CM | POA: Diagnosis not present

## 2022-07-17 DIAGNOSIS — M7989 Other specified soft tissue disorders: Secondary | ICD-10-CM | POA: Diagnosis not present

## 2022-07-17 DIAGNOSIS — M25512 Pain in left shoulder: Secondary | ICD-10-CM | POA: Diagnosis not present

## 2022-07-17 DIAGNOSIS — I82712 Chronic embolism and thrombosis of superficial veins of left upper extremity: Secondary | ICD-10-CM | POA: Diagnosis not present

## 2022-07-17 DIAGNOSIS — S42212D Unspecified displaced fracture of surgical neck of left humerus, subsequent encounter for fracture with routine healing: Secondary | ICD-10-CM | POA: Diagnosis not present

## 2022-07-20 DIAGNOSIS — S42212D Unspecified displaced fracture of surgical neck of left humerus, subsequent encounter for fracture with routine healing: Secondary | ICD-10-CM | POA: Diagnosis not present

## 2022-07-22 DIAGNOSIS — F329 Major depressive disorder, single episode, unspecified: Secondary | ICD-10-CM | POA: Diagnosis not present

## 2022-07-22 DIAGNOSIS — K219 Gastro-esophageal reflux disease without esophagitis: Secondary | ICD-10-CM | POA: Diagnosis not present

## 2022-07-22 DIAGNOSIS — I809 Phlebitis and thrombophlebitis of unspecified site: Secondary | ICD-10-CM | POA: Diagnosis not present

## 2022-07-22 DIAGNOSIS — I10 Essential (primary) hypertension: Secondary | ICD-10-CM | POA: Diagnosis not present

## 2022-07-22 DIAGNOSIS — F419 Anxiety disorder, unspecified: Secondary | ICD-10-CM | POA: Diagnosis not present

## 2022-07-22 DIAGNOSIS — J309 Allergic rhinitis, unspecified: Secondary | ICD-10-CM | POA: Diagnosis not present

## 2022-07-22 DIAGNOSIS — E7849 Other hyperlipidemia: Secondary | ICD-10-CM | POA: Diagnosis not present

## 2022-07-22 DIAGNOSIS — Z6830 Body mass index (BMI) 30.0-30.9, adult: Secondary | ICD-10-CM | POA: Diagnosis not present

## 2022-07-23 ENCOUNTER — Ambulatory Visit (HOSPITAL_COMMUNITY): Payer: Medicare Other | Attending: Cardiology

## 2022-07-23 DIAGNOSIS — I351 Nonrheumatic aortic (valve) insufficiency: Secondary | ICD-10-CM | POA: Diagnosis not present

## 2022-07-23 LAB — ECHOCARDIOGRAM COMPLETE
Area-P 1/2: 3.4 cm2
P 1/2 time: 804 msec
S' Lateral: 3.3 cm

## 2022-07-30 DIAGNOSIS — R29898 Other symptoms and signs involving the musculoskeletal system: Secondary | ICD-10-CM | POA: Diagnosis not present

## 2022-07-30 DIAGNOSIS — M25611 Stiffness of right shoulder, not elsewhere classified: Secondary | ICD-10-CM | POA: Diagnosis not present

## 2022-07-30 DIAGNOSIS — M25512 Pain in left shoulder: Secondary | ICD-10-CM | POA: Diagnosis not present

## 2022-07-31 DIAGNOSIS — M25611 Stiffness of right shoulder, not elsewhere classified: Secondary | ICD-10-CM | POA: Diagnosis not present

## 2022-07-31 DIAGNOSIS — M25512 Pain in left shoulder: Secondary | ICD-10-CM | POA: Diagnosis not present

## 2022-07-31 DIAGNOSIS — R29898 Other symptoms and signs involving the musculoskeletal system: Secondary | ICD-10-CM | POA: Diagnosis not present

## 2022-08-03 DIAGNOSIS — R29898 Other symptoms and signs involving the musculoskeletal system: Secondary | ICD-10-CM | POA: Diagnosis not present

## 2022-08-03 DIAGNOSIS — M25512 Pain in left shoulder: Secondary | ICD-10-CM | POA: Diagnosis not present

## 2022-08-03 DIAGNOSIS — M25611 Stiffness of right shoulder, not elsewhere classified: Secondary | ICD-10-CM | POA: Diagnosis not present

## 2022-08-07 DIAGNOSIS — M25611 Stiffness of right shoulder, not elsewhere classified: Secondary | ICD-10-CM | POA: Diagnosis not present

## 2022-08-07 DIAGNOSIS — M25512 Pain in left shoulder: Secondary | ICD-10-CM | POA: Diagnosis not present

## 2022-08-07 DIAGNOSIS — R29898 Other symptoms and signs involving the musculoskeletal system: Secondary | ICD-10-CM | POA: Diagnosis not present

## 2022-08-10 ENCOUNTER — Encounter: Payer: Self-pay | Admitting: Cardiovascular Disease

## 2022-08-10 ENCOUNTER — Ambulatory Visit (INDEPENDENT_AMBULATORY_CARE_PROVIDER_SITE_OTHER): Payer: Medicare Other | Admitting: Cardiovascular Disease

## 2022-08-10 DIAGNOSIS — E782 Mixed hyperlipidemia: Secondary | ICD-10-CM

## 2022-08-10 DIAGNOSIS — I351 Nonrheumatic aortic (valve) insufficiency: Secondary | ICD-10-CM

## 2022-08-10 DIAGNOSIS — I10 Essential (primary) hypertension: Secondary | ICD-10-CM | POA: Diagnosis not present

## 2022-08-10 NOTE — Assessment & Plan Note (Signed)
History of aortic insufficiency by 2D echo which most recently was performed 07/23/2022 revealing moderate to severe AI.  Her LV size and function were normal.  She had grade 1 diastolic dysfunction.  She does complain of some shortness of breath.  We will continue to follow her 2D echo on an annual basis.

## 2022-08-10 NOTE — Patient Instructions (Signed)
Medication Instructions:  No changes *If you need a refill on your cardiac medications before your next appointment, please call your pharmacy*   Lab Work: None ordered If you have labs (blood work) drawn today and your tests are completely normal, you will receive your results only by: [facility] (if you have MyChart) OR A paper copy in the mail If you have any lab test that is abnormal or we need to change your treatment, we will call you to review the results.   Testing/Procedures: Your physician has requested that you have an echocardiogram in 12 month. Echocardiography is a painless test that uses sound waves to create images of your heart. It provides your doctor with information about the size and shape of your heart and how well your heart's chambers and valves are working. You may receive an ultrasound enhancing agent through an IV if needed to better visualize your heart during the echo.This procedure takes approximately one hour. There are no restrictions for this procedure. This will take place at the [address], [address].     Follow-Up: At [facility], you and your health needs are our priority.  As part of our continuing mission to provide you with exceptional heart care, we have created designated Provider Care Teams.  These Care Teams include your primary Cardiologist (physician) and Advanced Practice Providers (APPs -  Physician Assistants and Nurse Practitioners) who all work together to provide you with the care you need, when you need it.  We recommend signing up for the patient portal called "MyChart".  Sign up information is provided on this After Visit Summary.  MyChart is used to connect with patients for Virtual Visits (Telemedicine).  Patients are able to view lab/test results, encounter notes, upcoming appointments, etc.  Non-urgent messages can be sent to your provider as well.   To learn more about what you can do with MyChart, go to  [email or web address].    Your next appointment:   6 month(s)  The format for your next appointment:   In Person  Provider:   Quay Burow, MD {

## 2022-08-10 NOTE — Assessment & Plan Note (Signed)
History of hyperlipidemia on statin therapy with lipid profile performed 01/05/2022 revealing total cholesterol 181, LDL of 103 and HDL 55

## 2022-08-10 NOTE — Assessment & Plan Note (Signed)
History of palpitations in the past thought to be PACs.  She no longer has these.

## 2022-08-10 NOTE — Assessment & Plan Note (Signed)
History of essential hypertension with blood pressure measured today at 136/76.  She is on low-dose amlodipine and metoprolol as well as valsartan.

## 2022-08-10 NOTE — Progress Notes (Signed)
08/10/2022 Judy Mitchell   29-Jul-1951  [id number]  Primary Physician Rosalee Kaufman, PA-C Primary Cardiologist: Lorretta Harp MD [facility], [place], [place]  HPI:  Judy Mitchell is a 71 y.o.    Caucasian female no children who is retired from working at the [employer].  She was referred by Clemmie Krill, PA-C for evaluation of PACs.  I last saw her in the office 04/22/2021. She has no prior cardiac history.  Her only risk factors include treated hypertension.  She is never had a heart attack or stroke.  She denies chest pain or shortness of breath.  She does have a hiatal hernia with GERD on omeprazole followed by Dr. Trilby Leaver   She noticed new onset palpitations approximately 6 months ago occurring in the early morning hours awaking feeling her from sleep frequently at around 4 AM .   There are no other associated symptoms.  They are self-limited.  She has limited caffeine and chocolate from her diet.  Her thyroid function tests have been normal.   She was under a lot of stress at home with an ailing husband.  Her palpitations have improved with the addition of Prozac, Xanax and elimination of caffeine.  A 2D echo was performed that showed normal LV systolic function, mild LV dilatation with mild to moderate AI and mild to moderate pulmonary hypertension.  She underwent successful laparoscopic Nissen fundoplication by Dr. Kaylyn Lim which resulted in improvement in her reflux symptoms.    She unfortunately lost her mother this past summer for metastatic melanoma. She describes her as "her best friend". She has noticed increased frequency and severity of palpitations in the last several weeks without identifiable cause. These had improved with abstaining from caffeine intake, anxiety lytics and low-dose beta-blocker.   Since I saw her a year and 4 months ago she continues to do well.  She did break her left arm recently from a mechanical fall.  She is also put on 13 pound  since I last saw her.  She complains of increasing shortness of breath but denies chest pain.  2D echo performed 07/23/2022 revealed normal LV size and function with grade 1 diastolic dysfunction and moderate to severe aortic insufficiency.   Current Meds  Medication Sig   ALPRAZolam (XANAX) 0.25 MG tablet Take 0.125 mg by mouth 2 (two) times daily as needed for anxiety.    amLODipine (NORVASC) 2.5 MG tablet Take 1 tablet (2.5 mg total) by mouth daily. 1st attempt. Please keep appointment for additional refills.   aspirin EC 81 MG tablet Take 1 tablet by mouth daily.   atorvastatin (LIPITOR) 20 MG tablet Take 1 tablet (20 mg total) by mouth daily.   FLUoxetine (PROZAC) 40 MG capsule Take 40 mg by mouth at bedtime.    gabapentin (NEURONTIN) 100 MG capsule Take 100 mg by mouth at bedtime as needed.   metoprolol succinate (TOPROL-XL) 50 MG 24 hr tablet TAKE 1 TABLET BY MOUTH EVERY DAY   valsartan (DIOVAN) 160 MG tablet Take 1 tablet (160 mg total) by mouth daily. KEEP OV.     Allergies  Allergen Reactions   Sulfa Antibiotics Itching and Rash    Social History   Socioeconomic History   Marital status: Married    Spouse name: Not on file   Number of children: Not on file   Years of education: Not on file   Highest education level: Not on file  Occupational History   Not  on file  Tobacco Use   Smoking status: Never   Smokeless tobacco: Never  Vaping Use   Vaping Use: Never used  Substance and Sexual Activity   Alcohol use: Yes    Comment: rarely   Drug use: No   Sexual activity: Not on file  Other Topics Concern   Not on file  Social History Narrative   Not on file   Social Determinants of Health   Financial Resource Strain: Not on file  Food Insecurity: Not on file  Transportation Needs: Not on file  Physical Activity: Not on file  Stress: Not on file  Social Connections: Not on file  Intimate Partner Violence: Not on file     Review of Systems: General: negative  for chills, fever, night sweats or weight changes.  Cardiovascular: negative for chest pain, dyspnea on exertion, edema, orthopnea, palpitations, paroxysmal nocturnal dyspnea or shortness of breath Dermatological: negative for rash Respiratory: negative for cough or wheezing Urologic: negative for hematuria Abdominal: negative for nausea, vomiting, diarrhea, bright red blood per rectum, melena, or hematemesis Neurologic: negative for visual changes, syncope, or dizziness All other systems reviewed and are otherwise negative except as noted above.    Blood pressure 136/76, pulse 78, height '5\' 6"'$  (1.676 m), weight 188 lb 12.8 oz (85.6 kg), SpO2 97 %.  General appearance: alert and no distress Neck: no adenopathy, no carotid bruit, no JVD, supple, symmetrical, trachea midline, and thyroid not enlarged, symmetric, no tenderness/mass/nodules Lungs: clear to auscultation bilaterally Heart: 1/6 diastolic murmur at the left sternal border consistent with aortic insufficiency. Extremities: extremities normal, atraumatic, no cyanosis or edema Pulses: 2+ and symmetric Skin: Skin color, texture, turgor normal. No rashes or lesions Neurologic: Grossly normal  EKG sinus rhythm at 78 with LVH voltage and poor R wave progression.  I personally reviewed this EKG.  ASSESSMENT AND PLAN:   Essential hypertension History of essential hypertension with blood pressure measured today at 136/76.  She is on low-dose amlodipine and metoprolol as well as valsartan.  Palpitations History of palpitations in the past thought to be PACs.  She no longer has these.  Hyperlipidemia History of hyperlipidemia on statin therapy with lipid profile performed 01/05/2022 revealing total cholesterol 181, LDL of 103 and HDL 55  Aortic insufficiency History of aortic insufficiency by 2D echo which most recently was performed 07/23/2022 revealing moderate to severe AI.  Her LV size and function were normal.  She had grade 1  diastolic dysfunction.  She does complain of some shortness of breath.  We will continue to follow her 2D echo on an annual basis.     Lorretta Harp MD FACP,FACC,FAHA, [facility] 08/10/2022 10:44 AM

## 2022-08-11 DIAGNOSIS — R29898 Other symptoms and signs involving the musculoskeletal system: Secondary | ICD-10-CM | POA: Diagnosis not present

## 2022-08-11 DIAGNOSIS — M25611 Stiffness of right shoulder, not elsewhere classified: Secondary | ICD-10-CM | POA: Diagnosis not present

## 2022-08-11 DIAGNOSIS — M25512 Pain in left shoulder: Secondary | ICD-10-CM | POA: Diagnosis not present

## 2022-08-13 DIAGNOSIS — R29898 Other symptoms and signs involving the musculoskeletal system: Secondary | ICD-10-CM | POA: Diagnosis not present

## 2022-08-13 DIAGNOSIS — M25611 Stiffness of right shoulder, not elsewhere classified: Secondary | ICD-10-CM | POA: Diagnosis not present

## 2022-08-13 DIAGNOSIS — M25512 Pain in left shoulder: Secondary | ICD-10-CM | POA: Diagnosis not present

## 2022-08-19 DIAGNOSIS — R29898 Other symptoms and signs involving the musculoskeletal system: Secondary | ICD-10-CM | POA: Diagnosis not present

## 2022-08-19 DIAGNOSIS — M25611 Stiffness of right shoulder, not elsewhere classified: Secondary | ICD-10-CM | POA: Diagnosis not present

## 2022-08-19 DIAGNOSIS — M25512 Pain in left shoulder: Secondary | ICD-10-CM | POA: Diagnosis not present

## 2022-08-21 DIAGNOSIS — R29898 Other symptoms and signs involving the musculoskeletal system: Secondary | ICD-10-CM | POA: Diagnosis not present

## 2022-08-21 DIAGNOSIS — M25512 Pain in left shoulder: Secondary | ICD-10-CM | POA: Diagnosis not present

## 2022-08-21 DIAGNOSIS — M25611 Stiffness of right shoulder, not elsewhere classified: Secondary | ICD-10-CM | POA: Diagnosis not present

## 2022-08-24 DIAGNOSIS — M25512 Pain in left shoulder: Secondary | ICD-10-CM | POA: Diagnosis not present

## 2022-08-24 DIAGNOSIS — R29898 Other symptoms and signs involving the musculoskeletal system: Secondary | ICD-10-CM | POA: Diagnosis not present

## 2022-08-24 DIAGNOSIS — M25611 Stiffness of right shoulder, not elsewhere classified: Secondary | ICD-10-CM | POA: Diagnosis not present

## 2022-08-25 DIAGNOSIS — L570 Actinic keratosis: Secondary | ICD-10-CM | POA: Diagnosis not present

## 2022-08-25 DIAGNOSIS — Z85828 Personal history of other malignant neoplasm of skin: Secondary | ICD-10-CM | POA: Diagnosis not present

## 2022-08-25 DIAGNOSIS — D2239 Melanocytic nevi of other parts of face: Secondary | ICD-10-CM | POA: Diagnosis not present

## 2022-08-25 DIAGNOSIS — L821 Other seborrheic keratosis: Secondary | ICD-10-CM | POA: Diagnosis not present

## 2022-08-25 DIAGNOSIS — D485 Neoplasm of uncertain behavior of skin: Secondary | ICD-10-CM | POA: Diagnosis not present

## 2022-08-28 DIAGNOSIS — M25512 Pain in left shoulder: Secondary | ICD-10-CM | POA: Diagnosis not present

## 2022-08-28 DIAGNOSIS — M25611 Stiffness of right shoulder, not elsewhere classified: Secondary | ICD-10-CM | POA: Diagnosis not present

## 2022-08-28 DIAGNOSIS — R29898 Other symptoms and signs involving the musculoskeletal system: Secondary | ICD-10-CM | POA: Diagnosis not present

## 2022-09-02 DIAGNOSIS — M25512 Pain in left shoulder: Secondary | ICD-10-CM | POA: Diagnosis not present

## 2022-09-02 DIAGNOSIS — R29898 Other symptoms and signs involving the musculoskeletal system: Secondary | ICD-10-CM | POA: Diagnosis not present

## 2022-09-02 DIAGNOSIS — M25611 Stiffness of right shoulder, not elsewhere classified: Secondary | ICD-10-CM | POA: Diagnosis not present

## 2022-09-03 DIAGNOSIS — S42212D Unspecified displaced fracture of surgical neck of left humerus, subsequent encounter for fracture with routine healing: Secondary | ICD-10-CM | POA: Diagnosis not present

## 2022-09-09 DIAGNOSIS — M25512 Pain in left shoulder: Secondary | ICD-10-CM | POA: Diagnosis not present

## 2022-09-09 DIAGNOSIS — R29898 Other symptoms and signs involving the musculoskeletal system: Secondary | ICD-10-CM | POA: Diagnosis not present

## 2022-09-09 DIAGNOSIS — M25611 Stiffness of right shoulder, not elsewhere classified: Secondary | ICD-10-CM | POA: Diagnosis not present

## 2022-09-10 DIAGNOSIS — R29898 Other symptoms and signs involving the musculoskeletal system: Secondary | ICD-10-CM | POA: Diagnosis not present

## 2022-09-10 DIAGNOSIS — M25512 Pain in left shoulder: Secondary | ICD-10-CM | POA: Diagnosis not present

## 2022-09-10 DIAGNOSIS — M25611 Stiffness of right shoulder, not elsewhere classified: Secondary | ICD-10-CM | POA: Diagnosis not present

## 2022-09-17 DIAGNOSIS — R29898 Other symptoms and signs involving the musculoskeletal system: Secondary | ICD-10-CM | POA: Diagnosis not present

## 2022-09-17 DIAGNOSIS — M25512 Pain in left shoulder: Secondary | ICD-10-CM | POA: Diagnosis not present

## 2022-09-17 DIAGNOSIS — M25611 Stiffness of right shoulder, not elsewhere classified: Secondary | ICD-10-CM | POA: Diagnosis not present

## 2022-09-18 DIAGNOSIS — M25512 Pain in left shoulder: Secondary | ICD-10-CM | POA: Diagnosis not present

## 2022-09-18 DIAGNOSIS — R29898 Other symptoms and signs involving the musculoskeletal system: Secondary | ICD-10-CM | POA: Diagnosis not present

## 2022-09-18 DIAGNOSIS — M25611 Stiffness of right shoulder, not elsewhere classified: Secondary | ICD-10-CM | POA: Diagnosis not present

## 2022-09-21 DIAGNOSIS — N760 Acute vaginitis: Secondary | ICD-10-CM | POA: Diagnosis not present

## 2022-09-23 DIAGNOSIS — M25611 Stiffness of right shoulder, not elsewhere classified: Secondary | ICD-10-CM | POA: Diagnosis not present

## 2022-09-23 DIAGNOSIS — R29898 Other symptoms and signs involving the musculoskeletal system: Secondary | ICD-10-CM | POA: Diagnosis not present

## 2022-09-23 DIAGNOSIS — M25512 Pain in left shoulder: Secondary | ICD-10-CM | POA: Diagnosis not present

## 2022-09-25 DIAGNOSIS — M25512 Pain in left shoulder: Secondary | ICD-10-CM | POA: Diagnosis not present

## 2022-09-25 DIAGNOSIS — M25611 Stiffness of right shoulder, not elsewhere classified: Secondary | ICD-10-CM | POA: Diagnosis not present

## 2022-09-25 DIAGNOSIS — R29898 Other symptoms and signs involving the musculoskeletal system: Secondary | ICD-10-CM | POA: Diagnosis not present

## 2022-09-28 DIAGNOSIS — M25512 Pain in left shoulder: Secondary | ICD-10-CM | POA: Diagnosis not present

## 2022-09-28 DIAGNOSIS — M25611 Stiffness of right shoulder, not elsewhere classified: Secondary | ICD-10-CM | POA: Diagnosis not present

## 2022-09-28 DIAGNOSIS — R29898 Other symptoms and signs involving the musculoskeletal system: Secondary | ICD-10-CM | POA: Diagnosis not present

## 2022-09-30 DIAGNOSIS — M25512 Pain in left shoulder: Secondary | ICD-10-CM | POA: Diagnosis not present

## 2022-09-30 DIAGNOSIS — M25611 Stiffness of right shoulder, not elsewhere classified: Secondary | ICD-10-CM | POA: Diagnosis not present

## 2022-09-30 DIAGNOSIS — R29898 Other symptoms and signs involving the musculoskeletal system: Secondary | ICD-10-CM | POA: Diagnosis not present

## 2022-10-07 DIAGNOSIS — M25512 Pain in left shoulder: Secondary | ICD-10-CM | POA: Diagnosis not present

## 2022-10-07 DIAGNOSIS — R29898 Other symptoms and signs involving the musculoskeletal system: Secondary | ICD-10-CM | POA: Diagnosis not present

## 2022-10-07 DIAGNOSIS — M25611 Stiffness of right shoulder, not elsewhere classified: Secondary | ICD-10-CM | POA: Diagnosis not present

## 2022-10-09 DIAGNOSIS — R29898 Other symptoms and signs involving the musculoskeletal system: Secondary | ICD-10-CM | POA: Diagnosis not present

## 2022-10-09 DIAGNOSIS — M25512 Pain in left shoulder: Secondary | ICD-10-CM | POA: Diagnosis not present

## 2022-10-09 DIAGNOSIS — M25611 Stiffness of right shoulder, not elsewhere classified: Secondary | ICD-10-CM | POA: Diagnosis not present

## 2022-10-12 DIAGNOSIS — R29898 Other symptoms and signs involving the musculoskeletal system: Secondary | ICD-10-CM | POA: Diagnosis not present

## 2022-10-12 DIAGNOSIS — M25512 Pain in left shoulder: Secondary | ICD-10-CM | POA: Diagnosis not present

## 2022-10-12 DIAGNOSIS — M25611 Stiffness of right shoulder, not elsewhere classified: Secondary | ICD-10-CM | POA: Diagnosis not present

## 2022-10-13 DIAGNOSIS — Z23 Encounter for immunization: Secondary | ICD-10-CM | POA: Diagnosis not present

## 2022-10-14 ENCOUNTER — Other Ambulatory Visit: Payer: Self-pay

## 2022-10-14 DIAGNOSIS — M25512 Pain in left shoulder: Secondary | ICD-10-CM | POA: Diagnosis not present

## 2022-10-14 DIAGNOSIS — R29898 Other symptoms and signs involving the musculoskeletal system: Secondary | ICD-10-CM | POA: Diagnosis not present

## 2022-10-14 DIAGNOSIS — M25611 Stiffness of right shoulder, not elsewhere classified: Secondary | ICD-10-CM | POA: Diagnosis not present

## 2022-10-14 MED ORDER — ATORVASTATIN CALCIUM 20 MG PO TABS: 20.0000 mg | ORAL_TABLET | Freq: Every day | ORAL | 3 refills | Status: DC

## 2022-10-21 DIAGNOSIS — R29898 Other symptoms and signs involving the musculoskeletal system: Secondary | ICD-10-CM | POA: Diagnosis not present

## 2022-10-21 DIAGNOSIS — M25611 Stiffness of right shoulder, not elsewhere classified: Secondary | ICD-10-CM | POA: Diagnosis not present

## 2022-10-21 DIAGNOSIS — M25512 Pain in left shoulder: Secondary | ICD-10-CM | POA: Diagnosis not present

## 2022-10-23 DIAGNOSIS — M25611 Stiffness of right shoulder, not elsewhere classified: Secondary | ICD-10-CM | POA: Diagnosis not present

## 2022-10-23 DIAGNOSIS — M25512 Pain in left shoulder: Secondary | ICD-10-CM | POA: Diagnosis not present

## 2022-10-23 DIAGNOSIS — R29898 Other symptoms and signs involving the musculoskeletal system: Secondary | ICD-10-CM | POA: Diagnosis not present

## 2022-10-26 DIAGNOSIS — R29898 Other symptoms and signs involving the musculoskeletal system: Secondary | ICD-10-CM | POA: Diagnosis not present

## 2022-10-26 DIAGNOSIS — M25512 Pain in left shoulder: Secondary | ICD-10-CM | POA: Diagnosis not present

## 2022-10-26 DIAGNOSIS — M25611 Stiffness of right shoulder, not elsewhere classified: Secondary | ICD-10-CM | POA: Diagnosis not present

## 2022-10-28 ENCOUNTER — Other Ambulatory Visit: Payer: Self-pay

## 2022-10-28 DIAGNOSIS — M25611 Stiffness of right shoulder, not elsewhere classified: Secondary | ICD-10-CM | POA: Diagnosis not present

## 2022-10-28 DIAGNOSIS — M25512 Pain in left shoulder: Secondary | ICD-10-CM | POA: Diagnosis not present

## 2022-10-28 DIAGNOSIS — R29898 Other symptoms and signs involving the musculoskeletal system: Secondary | ICD-10-CM | POA: Diagnosis not present

## 2022-10-28 MED ORDER — AMLODIPINE BESYLATE 2.5 MG PO TABS: 2.5000 mg | ORAL_TABLET | Freq: Every day | ORAL | 0 refills | Status: DC

## 2022-11-02 DIAGNOSIS — R29898 Other symptoms and signs involving the musculoskeletal system: Secondary | ICD-10-CM | POA: Diagnosis not present

## 2022-11-02 DIAGNOSIS — M25512 Pain in left shoulder: Secondary | ICD-10-CM | POA: Diagnosis not present

## 2022-11-02 DIAGNOSIS — M25611 Stiffness of right shoulder, not elsewhere classified: Secondary | ICD-10-CM | POA: Diagnosis not present

## 2022-11-04 DIAGNOSIS — R29898 Other symptoms and signs involving the musculoskeletal system: Secondary | ICD-10-CM | POA: Diagnosis not present

## 2022-11-04 DIAGNOSIS — M25512 Pain in left shoulder: Secondary | ICD-10-CM | POA: Diagnosis not present

## 2022-11-04 DIAGNOSIS — M25611 Stiffness of right shoulder, not elsewhere classified: Secondary | ICD-10-CM | POA: Diagnosis not present

## 2022-11-07 ENCOUNTER — Encounter (INDEPENDENT_AMBULATORY_CARE_PROVIDER_SITE_OTHER): Payer: Self-pay | Admitting: Gastroenterology

## 2022-11-11 DIAGNOSIS — M25512 Pain in left shoulder: Secondary | ICD-10-CM | POA: Diagnosis not present

## 2022-11-11 DIAGNOSIS — M25611 Stiffness of right shoulder, not elsewhere classified: Secondary | ICD-10-CM | POA: Diagnosis not present

## 2022-11-11 DIAGNOSIS — R29898 Other symptoms and signs involving the musculoskeletal system: Secondary | ICD-10-CM | POA: Diagnosis not present

## 2022-11-16 DIAGNOSIS — M25512 Pain in left shoulder: Secondary | ICD-10-CM | POA: Diagnosis not present

## 2022-11-16 DIAGNOSIS — M25611 Stiffness of right shoulder, not elsewhere classified: Secondary | ICD-10-CM | POA: Diagnosis not present

## 2022-11-16 DIAGNOSIS — R29898 Other symptoms and signs involving the musculoskeletal system: Secondary | ICD-10-CM | POA: Diagnosis not present

## 2022-11-30 DIAGNOSIS — Z6830 Body mass index (BMI) 30.0-30.9, adult: Secondary | ICD-10-CM | POA: Diagnosis not present

## 2022-11-30 DIAGNOSIS — R197 Diarrhea, unspecified: Secondary | ICD-10-CM | POA: Diagnosis not present

## 2022-11-30 DIAGNOSIS — N898 Other specified noninflammatory disorders of vagina: Secondary | ICD-10-CM | POA: Diagnosis not present

## 2022-11-30 DIAGNOSIS — R030 Elevated blood-pressure reading, without diagnosis of hypertension: Secondary | ICD-10-CM | POA: Diagnosis not present

## 2022-12-01 DIAGNOSIS — M25611 Stiffness of right shoulder, not elsewhere classified: Secondary | ICD-10-CM | POA: Diagnosis not present

## 2022-12-01 DIAGNOSIS — R29898 Other symptoms and signs involving the musculoskeletal system: Secondary | ICD-10-CM | POA: Diagnosis not present

## 2022-12-01 DIAGNOSIS — M25512 Pain in left shoulder: Secondary | ICD-10-CM | POA: Diagnosis not present

## 2022-12-03 DIAGNOSIS — R29898 Other symptoms and signs involving the musculoskeletal system: Secondary | ICD-10-CM | POA: Diagnosis not present

## 2022-12-03 DIAGNOSIS — M25611 Stiffness of right shoulder, not elsewhere classified: Secondary | ICD-10-CM | POA: Diagnosis not present

## 2022-12-03 DIAGNOSIS — M25512 Pain in left shoulder: Secondary | ICD-10-CM | POA: Diagnosis not present

## 2022-12-07 DIAGNOSIS — M25512 Pain in left shoulder: Secondary | ICD-10-CM | POA: Diagnosis not present

## 2022-12-07 DIAGNOSIS — R29898 Other symptoms and signs involving the musculoskeletal system: Secondary | ICD-10-CM | POA: Diagnosis not present

## 2022-12-07 DIAGNOSIS — M25611 Stiffness of right shoulder, not elsewhere classified: Secondary | ICD-10-CM | POA: Diagnosis not present

## 2022-12-10 DIAGNOSIS — R29898 Other symptoms and signs involving the musculoskeletal system: Secondary | ICD-10-CM | POA: Diagnosis not present

## 2022-12-10 DIAGNOSIS — M25512 Pain in left shoulder: Secondary | ICD-10-CM | POA: Diagnosis not present

## 2022-12-10 DIAGNOSIS — M25611 Stiffness of right shoulder, not elsewhere classified: Secondary | ICD-10-CM | POA: Diagnosis not present

## 2022-12-15 DIAGNOSIS — R29898 Other symptoms and signs involving the musculoskeletal system: Secondary | ICD-10-CM | POA: Diagnosis not present

## 2022-12-15 DIAGNOSIS — M25512 Pain in left shoulder: Secondary | ICD-10-CM | POA: Diagnosis not present

## 2022-12-15 DIAGNOSIS — M25611 Stiffness of right shoulder, not elsewhere classified: Secondary | ICD-10-CM | POA: Diagnosis not present

## 2023-01-18 DIAGNOSIS — E7849 Other hyperlipidemia: Secondary | ICD-10-CM | POA: Diagnosis not present

## 2023-01-18 DIAGNOSIS — K219 Gastro-esophageal reflux disease without esophagitis: Secondary | ICD-10-CM | POA: Diagnosis not present

## 2023-01-18 DIAGNOSIS — I10 Essential (primary) hypertension: Secondary | ICD-10-CM | POA: Diagnosis not present

## 2023-01-18 DIAGNOSIS — E039 Hypothyroidism, unspecified: Secondary | ICD-10-CM | POA: Diagnosis not present

## 2023-01-21 DIAGNOSIS — E7849 Other hyperlipidemia: Secondary | ICD-10-CM | POA: Diagnosis not present

## 2023-01-21 DIAGNOSIS — F329 Major depressive disorder, single episode, unspecified: Secondary | ICD-10-CM | POA: Diagnosis not present

## 2023-01-21 DIAGNOSIS — J309 Allergic rhinitis, unspecified: Secondary | ICD-10-CM | POA: Diagnosis not present

## 2023-01-21 DIAGNOSIS — Z1389 Encounter for screening for other disorder: Secondary | ICD-10-CM | POA: Diagnosis not present

## 2023-01-21 DIAGNOSIS — Z6831 Body mass index (BMI) 31.0-31.9, adult: Secondary | ICD-10-CM | POA: Diagnosis not present

## 2023-01-21 DIAGNOSIS — Z1331 Encounter for screening for depression: Secondary | ICD-10-CM | POA: Diagnosis not present

## 2023-01-21 DIAGNOSIS — Z0001 Encounter for general adult medical examination with abnormal findings: Secondary | ICD-10-CM | POA: Diagnosis not present

## 2023-01-21 DIAGNOSIS — F419 Anxiety disorder, unspecified: Secondary | ICD-10-CM | POA: Diagnosis not present

## 2023-01-21 DIAGNOSIS — I10 Essential (primary) hypertension: Secondary | ICD-10-CM | POA: Diagnosis not present

## 2023-01-21 DIAGNOSIS — K219 Gastro-esophageal reflux disease without esophagitis: Secondary | ICD-10-CM | POA: Diagnosis not present

## 2023-02-08 DIAGNOSIS — Z23 Encounter for immunization: Secondary | ICD-10-CM | POA: Diagnosis not present

## 2023-02-12 ENCOUNTER — Other Ambulatory Visit: Payer: Self-pay | Admitting: *Deleted

## 2023-02-12 MED ORDER — VALSARTAN 160 MG PO TABS: 160.0000 mg | ORAL_TABLET | Freq: Every day | ORAL | 0 refills | Status: DC

## 2023-02-15 ENCOUNTER — Other Ambulatory Visit: Payer: Self-pay

## 2023-02-15 DIAGNOSIS — Z1231 Encounter for screening mammogram for malignant neoplasm of breast: Secondary | ICD-10-CM | POA: Diagnosis not present

## 2023-02-15 MED ORDER — AMLODIPINE BESYLATE 2.5 MG PO TABS: 2.5000 mg | ORAL_TABLET | Freq: Every day | ORAL | 0 refills | Status: DC

## 2023-02-23 ENCOUNTER — Encounter: Payer: Self-pay | Admitting: Cardiovascular Disease

## 2023-02-23 ENCOUNTER — Ambulatory Visit: Payer: Medicare Other | Attending: Cardiovascular Disease | Admitting: Cardiovascular Disease

## 2023-02-23 ENCOUNTER — Telehealth: Payer: Self-pay | Admitting: Licensed Clinical Social Worker

## 2023-02-23 VITALS — BP 150/68 | HR 65 | Ht 66.0 in | Wt 193.8 lb

## 2023-02-23 DIAGNOSIS — I10 Essential (primary) hypertension: Secondary | ICD-10-CM | POA: Diagnosis not present

## 2023-02-23 DIAGNOSIS — K449 Diaphragmatic hernia without obstruction or gangrene: Secondary | ICD-10-CM

## 2023-02-23 DIAGNOSIS — E782 Mixed hyperlipidemia: Secondary | ICD-10-CM

## 2023-02-23 DIAGNOSIS — I351 Nonrheumatic aortic (valve) insufficiency: Secondary | ICD-10-CM

## 2023-02-23 NOTE — Assessment & Plan Note (Signed)
History of essential hypertension blood pressure measured today at 150/68.  She is on low-dose amlodipine, metoprolol and Diovan.

## 2023-02-23 NOTE — Assessment & Plan Note (Signed)
History of hiatal hernia status post successful Nissen fundoplication.

## 2023-02-23 NOTE — Progress Notes (Signed)
02/23/2023 Judy Mitchell   [birth year]  PT:[id number]  Primary Physician Rosalee Kaufman, PA-C Primary Cardiologist: Lorretta Harp MD [name], [place]  HPI:  Judy Mitchell is a 72 y.o. married Caucasian female no children who is retired from working at the [employer].  She was referred by Clemmie Krill, PA-C for evaluation of PACs.  I last saw her in the office 08/10/2022. She has no prior cardiac history.  Her only risk factors include treated hypertension.  She is never had a heart attack or stroke.  She denies chest pain or shortness of breath.  She does have a hiatal hernia with GERD on omeprazole followed by Dr. Trilby Leaver   She noticed new onset palpitations approximately 6 months ago occurring in the early morning hours awaking feeling her from sleep frequently at around 4 AM .   There are no other associated symptoms.  They are self-limited.  She has limited caffeine and chocolate from her diet.  Her thyroid function tests have been normal.   She was under a lot of stress at home with an ailing husband.  Her palpitations have improved with the addition of Prozac, Xanax and elimination of caffeine.  A 2D echo was performed that showed normal LV systolic function, mild LV dilatation with mild to moderate AI and mild to moderate pulmonary hypertension.  She underwent successful laparoscopic Nissen fundoplication by Dr. Kaylyn Lim which resulted in improvement in her reflux symptoms.    She unfortunately lost her mother this past summer for metastatic melanoma. She describes her as "her best friend". She has noticed increased frequency and severity of palpitations in the last several weeks without identifiable cause. These had improved with abstaining from caffeine intake, anxiety lytics and low-dose beta-blocker.   Since I saw her in the office 6 months ago she continues to do well.  She has continued to gain some weight.  She she sounds depressed given her home  situation.  She not exercise much.  She does complain of some mild to moderate shortness of breath.  Her 2D echo performed 07/23/2022 showed moderate to severe AI with normal LV systolic function grade 1 diastolic dysfunction.   Current Meds  Medication Sig   ALPRAZolam (XANAX) 0.25 MG tablet Take 0.125 mg by mouth 2 (two) times daily as needed for anxiety.    amLODipine (NORVASC) 2.5 MG tablet Take 1 tablet (2.5 mg total) by mouth daily. Please schedule appointment for additional refills.   atorvastatin (LIPITOR) 20 MG tablet Take 1 tablet (20 mg total) by mouth daily.   FLUoxetine (PROZAC) 40 MG capsule Take 40 mg by mouth at bedtime.    metoprolol succinate (TOPROL-XL) 50 MG 24 hr tablet TAKE 1 TABLET BY MOUTH EVERY DAY   valsartan (DIOVAN) 160 MG tablet Take 1 tablet (160 mg total) by mouth daily. KEEP OV.     Allergies  Allergen Reactions   Sulfa Antibiotics Itching and Rash    Social History   Socioeconomic History   Marital status: Married    Spouse name: Not on file   Number of children: Not on file   Years of education: Not on file   Highest education level: Not on file  Occupational History   Not on file  Tobacco Use   Smoking status: Never   Smokeless tobacco: Never  Vaping Use   Vaping Use: Never used  Substance and Sexual Activity   Alcohol use: Yes    Comment:  rarely   Drug use: No   Sexual activity: Not on file  Other Topics Concern   Not on file  Social History Narrative   Not on file   Social Determinants of Health   Financial Resource Strain: Not on file  Food Insecurity: Not on file  Transportation Needs: Not on file  Physical Activity: Not on file  Stress: Not on file  Social Connections: Not on file  Intimate Partner Violence: Not on file     Review of Systems: General: negative for chills, fever, night sweats or weight changes.  Cardiovascular: negative for chest pain, dyspnea on exertion, edema, orthopnea, palpitations, paroxysmal  nocturnal dyspnea or shortness of breath Dermatological: negative for rash Respiratory: negative for cough or wheezing Urologic: negative for hematuria Abdominal: negative for nausea, vomiting, diarrhea, bright red blood per rectum, melena, or hematemesis Neurologic: negative for visual changes, syncope, or dizziness All other systems reviewed and are otherwise negative except as noted above.    Blood pressure (!) 150/68, pulse 65, height '5\' 6"'$  (1.676 m), weight 193 lb 12.8 oz (87.9 kg), SpO2 95 %.  General appearance: alert and no distress Neck: no adenopathy, no carotid bruit, no JVD, supple, symmetrical, trachea midline, and thyroid not enlarged, symmetric, no tenderness/mass/nodules Lungs: clear to auscultation bilaterally Heart: regular rate and rhythm, S1, S2 normal, no murmur, click, rub or gallop Extremities: extremities normal, atraumatic, no cyanosis or edema Pulses: 2+ and symmetric Skin: Skin color, texture, turgor normal. No rashes or lesions Neurologic: Grossly normal  EKG sinus rhythm at 65 with LVH voltage.  I personally reviewed this EKG.  ASSESSMENT AND PLAN:   Essential hypertension History of essential hypertension blood pressure measured today at 150/68.  She is on low-dose amlodipine, metoprolol and Diovan.  Hiatal hernia History of hiatal hernia status post successful Nissen fundoplication.  Hyperlipidemia History of hyperlipidemia on statin therapy with lipid profile performed 01/05/2022 revealing a total cholesterol 181, LDL 103 and HDL 55.  Aortic insufficiency History of moderate to severe aortic insufficiency by 2D echo performed 07/23/2022.  LV function was normal.  She had grade 1 diastolic dysfunction.  Her echo had not changed since her prior 2D 07/12/2021.  Will repeat a 2D echocardiogram.     Lorretta Harp MD [facility], [facility] 02/23/2023 9:09 AM

## 2023-02-23 NOTE — Progress Notes (Signed)
Heart and Vascular Care Navigation  02/23/2023  Judy Mitchell 1951-03-26 [id number]  Reason for Referral: community supports for grief and caregiving Patient is participating in a Managed Medicaid Plan:No, Traditional Medicare and [place] with patient by telephone for initial visit for Heart and Vascular Care Coordination.                                                                                                   Assessment:   LCSW spoke with pt via telephone regarding community resources for caregiving and grief support. Pt was reached at [phone]. Introduced self, role, reason for call, at this time pt didn't want to share much but we discussed community supports and resources are available for grief and caregiving; both of which were noted in the care notes from today's visit.   Pt lives alone with her husband, she denies any issues obtaining or affording medications or food. She has transportation as needed, and no issues with housing costs. She is okay with individual and group supports- "it may be nice to have someone of the same ilk to talk to." LCSW offered to mail these resources to pt or e-mail them directly. She request for me to email them. I will do so to [email or web address]. see below for resources provided. I will also provide information about Elias Else, PsyD and [place] teams who work with our heart and vascular providers for patients that may be looking for individual support.    Encouraged pt to let me know if I could be of any additional assistance/if some of the resources do not meet what she is looking for. I also let pt know if she wanted to discuss anything further to give me a call when she feels comfortable. No additional questions at this time.                                HRT/VAS Care Coordination     Patients Home Cardiology Office [place] Team Social Worker   Social Worker Name:  Westley Hummer, [place], [place]   Living arrangements for the past 2 months Single Family Home   Lives with: Spouse   Patient Current Insurance Coverage Traditional Medicare  and AARP supplement   Patient Has Concern With Paying Medical Bills No   Does Patient Have Prescription Coverage? Yes   Home Assistive Devices/Equipment None       Social History:                                                                             SDOH Screenings   Food Insecurity: No Food Insecurity (02/23/2023)  Housing: Low Risk  (02/23/2023)  Transportation Needs: No Transportation Needs (02/23/2023)  Utilities: Not At Risk (02/23/2023)  Financial Resource Strain: Low Risk  (02/23/2023)  Stress: Stress Concern Present (02/23/2023)  Tobacco Use: Low Risk  (02/23/2023)    SDOH Interventions: Financial Resources:  Financial Strain Interventions: Intervention Not Indicated  Food Insecurity:  Food Insecurity Interventions: Intervention Not Indicated  Housing Insecurity:  Housing Interventions: Intervention Not Indicated  Transportation:   Transportation Interventions: Intervention Not Indicated    Other Care Navigation Interventions:     Patient expressed Mental Health concerns Yes, Referred to:  see below   Follow-up plan:   LCSW has sent pt the following resources via e-mail along with information about Elias Else and [place]: [place], online grief support group Christin Hunnicutt, social worker, Counsellor, [phone] [email or web address] Wednesday nights, 7-8pm   [place], [phone] Family Caregivers  [place], [place] ([email or web address]),  [place], [place] ([email or web address]  [employer] [address] [place], [place], [address] [phone]- Caregiving resources and information for senior resources and  events  GriefShare Groups: [facility], [address] [place], [place], [phone] Rob Robbins, [place] Counselor, [phone], $0 meeting in person in room A-10 Mondays at [place] currently  [facility], [facility], [address], Linna Hoff, [place], Noreene Larsson, leader/coordinator, [phone] $0 meeting in person back entrance to funeral home Tuesdays at 6:30pm currently  [employer], [place], [place], [place], [address] Jaret McBryde, [place], [phone], $0 meeting in person, in fellowship hall downstairs Thursdays at [place] starting March 7th

## 2023-02-23 NOTE — Assessment & Plan Note (Signed)
History of hyperlipidemia on statin therapy with lipid profile performed 01/05/2022 revealing a total cholesterol 181, LDL 103 and HDL 55.

## 2023-02-23 NOTE — Patient Instructions (Addendum)
Medication Instructions:  Your physician recommends that you continue on your current medications as directed. Please refer to the Current Medication list given to you today.  *If you need a refill on your cardiac medications before your next appointment, please call your pharmacy*   Testing/Procedures: Your physician has requested that you have an echocardiogram. Echocardiography is a painless test that uses sound waves to create images of your heart. It provides your doctor with information about the size and shape of your heart and how well your heart's chambers and valves are working. This procedure takes approximately one hour. There are no restrictions for this procedure. Please do NOT wear cologne, perfume, aftershave, or lotions (deodorant is allowed). Please arrive 15 minutes prior to your appointment time. This procedure will be done at [address] To be done in July.   Follow-Up: At [facility], you and your health needs are our priority.  As part of our continuing mission to provide you with exceptional heart care, we have created designated Provider Care Teams.  These Care Teams include your primary Cardiologist (physician) and Advanced Practice Providers (APPs -  Physician Assistants and Nurse Practitioners) who all work together to provide you with the care you need, when you need it.  We recommend signing up for the patient portal called "MyChart".  Sign up information is provided on this After Visit Summary.  MyChart is used to connect with patients for Virtual Visits (Telemedicine).  Patients are able to view lab/test results, encounter notes, upcoming appointments, etc.  Non-urgent messages can be sent to your provider as well.   To learn more about what you can do with MyChart, go to [email or web address].    Your next appointment:   6 month(s)  Provider:   Fabian Sharp, PA-C, Sande Rives, PA-C, Caron Presume, PA-C, Jory Sims, DNP,  ANP, Almyra Deforest, PA-C, or Diona Browner, NP      Then, Quay Burow, MD will plan to see you again in 12 month(s).

## 2023-02-23 NOTE — Assessment & Plan Note (Signed)
History of moderate to severe aortic insufficiency by 2D echo performed 07/23/2022.  LV function was normal.  She had grade 1 diastolic dysfunction.  Her echo had not changed since her prior 2D 07/12/2021.  Will repeat a 2D echocardiogram.

## 2023-02-25 ENCOUNTER — Encounter: Payer: Self-pay | Admitting: Radiology

## 2023-02-25 ENCOUNTER — Telehealth: Payer: Self-pay | Admitting: Licensed Clinical Social Worker

## 2023-02-25 NOTE — Telephone Encounter (Signed)
H&V Care Navigation CSW Progress Note  Clinical Social Worker contacted patient by phone to f/u on resources sent to pt via email as requested. Pt answered this morning at [phone], she shares she checked but didn't see the email, she will check her spam/junk box and give me a call back if she still doesn't see them. I shared if she does have them she can also call and let me know or respond back to the email and let me know they have reached her. No additional questions/concerns.  Patient is participating in a Managed Medicaid Plan:  No, Traditional Medicare and [place]: No Food Insecurity (02/23/2023)  Housing: Low Risk  (02/23/2023)  Transportation Needs: No Transportation Needs (02/23/2023)  Utilities: Not At Risk (02/23/2023)  Financial Resource Strain: Low Risk  (02/23/2023)  Stress: Stress Concern Present (02/23/2023)  Tobacco Use: Low Risk  (02/23/2023)   Westley Hummer, MSW, [place]  [phone]- work cell phone (preferred) [phone]- desk phone

## 2023-03-02 ENCOUNTER — Telehealth: Payer: Self-pay | Admitting: Licensed Clinical Social Worker

## 2023-03-02 NOTE — Telephone Encounter (Signed)
H&V Care Navigation CSW Progress Note  Clinical Social Worker contacted patient by phone to f/u on resources sent to pt via email as requested. Pt answered this afternoon at [phone], she states it is "not a good time" and asks me to call her after 9am on Friday. I will make note to do so.    Patient is participating in a Managed Medicaid Plan:  No, Traditional Medicare and [place]: No Food Insecurity (02/23/2023)  Housing: Low Risk  (02/23/2023)  Transportation Needs: No Transportation Needs (02/23/2023)  Utilities: Not At Risk (02/23/2023)  Financial Resource Strain: Low Risk  (02/23/2023)  Stress: Stress Concern Present (02/23/2023)  Tobacco Use: Low Risk  (02/23/2023)   Westley Hummer, MSW, [place]  [phone]- work cell phone (preferred) [phone]- desk phone

## 2023-03-03 ENCOUNTER — Telehealth: Payer: Self-pay | Admitting: Licensed Clinical Social Worker

## 2023-03-03 DIAGNOSIS — R922 Inconclusive mammogram: Secondary | ICD-10-CM | POA: Diagnosis not present

## 2023-03-03 DIAGNOSIS — R928 Other abnormal and inconclusive findings on diagnostic imaging of breast: Secondary | ICD-10-CM | POA: Diagnosis not present

## 2023-03-03 NOTE — Telephone Encounter (Signed)
H&V Care Navigation CSW Progress Note  Clinical Social Worker contacted patient by phone to f/u on resources. Pt had requested I call her at this time. No answer, left voicemail encouraging pt to reach out if I can be of further assistance/with any questions/concerns.  Unclear if pt received resources at this time as she has not communicated that to me despite being asked a few times. I remain available.  Patient is participating in a Managed Medicaid Plan:  No, Medicare A&B and supplement.  SDOH Screenings   Food Insecurity: No Food Insecurity (02/23/2023)  Housing: Low Risk  (02/23/2023)  Transportation Needs: No Transportation Needs (02/23/2023)  Utilities: Not At Risk (02/23/2023)  Financial Resource Strain: Low Risk  (02/23/2023)  Stress: Stress Concern Present (02/23/2023)  Tobacco Use: Low Risk  (02/23/2023)   Westley Hummer, MSW, [place]  [phone]- work cell phone (preferred) [phone]- desk phone

## 2023-03-16 ENCOUNTER — Other Ambulatory Visit: Payer: Self-pay | Admitting: Cardiovascular Disease

## 2023-03-25 DIAGNOSIS — Z85828 Personal history of other malignant neoplasm of skin: Secondary | ICD-10-CM | POA: Diagnosis not present

## 2023-03-25 DIAGNOSIS — D485 Neoplasm of uncertain behavior of skin: Secondary | ICD-10-CM | POA: Diagnosis not present

## 2023-03-25 DIAGNOSIS — L821 Other seborrheic keratosis: Secondary | ICD-10-CM | POA: Diagnosis not present

## 2023-03-25 DIAGNOSIS — D0439 Carcinoma in situ of skin of other parts of face: Secondary | ICD-10-CM | POA: Diagnosis not present

## 2023-03-25 DIAGNOSIS — C44519 Basal cell carcinoma of skin of other part of trunk: Secondary | ICD-10-CM | POA: Diagnosis not present

## 2023-04-02 DIAGNOSIS — M859 Disorder of bone density and structure, unspecified: Secondary | ICD-10-CM | POA: Diagnosis not present

## 2023-04-02 DIAGNOSIS — M816 Localized osteoporosis [Lequesne]: Secondary | ICD-10-CM | POA: Diagnosis not present

## 2023-04-15 DIAGNOSIS — M25532 Pain in left wrist: Secondary | ICD-10-CM | POA: Diagnosis not present

## 2023-04-23 DIAGNOSIS — M25532 Pain in left wrist: Secondary | ICD-10-CM | POA: Diagnosis not present

## 2023-04-23 DIAGNOSIS — M1812 Unilateral primary osteoarthritis of first carpometacarpal joint, left hand: Secondary | ICD-10-CM | POA: Diagnosis not present

## 2023-04-30 DIAGNOSIS — E559 Vitamin D deficiency, unspecified: Secondary | ICD-10-CM | POA: Diagnosis not present

## 2023-04-30 DIAGNOSIS — M25532 Pain in left wrist: Secondary | ICD-10-CM | POA: Diagnosis not present

## 2023-05-16 ENCOUNTER — Other Ambulatory Visit: Payer: Self-pay | Admitting: Cardiovascular Disease

## 2023-05-16 DIAGNOSIS — R002 Palpitations: Secondary | ICD-10-CM

## 2023-05-18 DIAGNOSIS — F419 Anxiety disorder, unspecified: Secondary | ICD-10-CM | POA: Diagnosis not present

## 2023-05-18 DIAGNOSIS — E7849 Other hyperlipidemia: Secondary | ICD-10-CM | POA: Diagnosis not present

## 2023-05-18 DIAGNOSIS — I10 Essential (primary) hypertension: Secondary | ICD-10-CM | POA: Diagnosis not present

## 2023-05-18 DIAGNOSIS — E669 Obesity, unspecified: Secondary | ICD-10-CM | POA: Diagnosis not present

## 2023-05-18 DIAGNOSIS — F329 Major depressive disorder, single episode, unspecified: Secondary | ICD-10-CM | POA: Diagnosis not present

## 2023-05-18 DIAGNOSIS — R7303 Prediabetes: Secondary | ICD-10-CM | POA: Diagnosis not present

## 2023-05-18 DIAGNOSIS — K219 Gastro-esophageal reflux disease without esophagitis: Secondary | ICD-10-CM | POA: Diagnosis not present

## 2023-05-18 DIAGNOSIS — J309 Allergic rhinitis, unspecified: Secondary | ICD-10-CM | POA: Diagnosis not present

## 2023-05-18 DIAGNOSIS — Z6831 Body mass index (BMI) 31.0-31.9, adult: Secondary | ICD-10-CM | POA: Diagnosis not present

## 2023-06-23 DIAGNOSIS — F329 Major depressive disorder, single episode, unspecified: Secondary | ICD-10-CM | POA: Diagnosis not present

## 2023-06-23 DIAGNOSIS — F419 Anxiety disorder, unspecified: Secondary | ICD-10-CM | POA: Diagnosis not present

## 2023-06-28 DIAGNOSIS — Z23 Encounter for immunization: Secondary | ICD-10-CM | POA: Diagnosis not present

## 2023-07-05 ENCOUNTER — Other Ambulatory Visit: Payer: Self-pay

## 2023-07-05 MED ORDER — AMLODIPINE BESYLATE 2.5 MG PO TABS: 2.5000 mg | ORAL_TABLET | Freq: Every day | ORAL | 2 refills | Status: DC

## 2023-07-14 ENCOUNTER — Ambulatory Visit (HOSPITAL_COMMUNITY): Payer: Medicare Other | Attending: Internal Medicine

## 2023-07-14 DIAGNOSIS — K449 Diaphragmatic hernia without obstruction or gangrene: Secondary | ICD-10-CM | POA: Diagnosis not present

## 2023-07-14 DIAGNOSIS — I351 Nonrheumatic aortic (valve) insufficiency: Secondary | ICD-10-CM

## 2023-07-14 DIAGNOSIS — E782 Mixed hyperlipidemia: Secondary | ICD-10-CM | POA: Diagnosis not present

## 2023-07-14 DIAGNOSIS — I10 Essential (primary) hypertension: Secondary | ICD-10-CM | POA: Diagnosis not present

## 2023-07-14 LAB — ECHOCARDIOGRAM COMPLETE
Area-P 1/2: 4.77 cm2
P 1/2 time: 430 msec
S' Lateral: 4.1 cm

## 2023-07-15 ENCOUNTER — Other Ambulatory Visit (HOSPITAL_COMMUNITY): Payer: Self-pay | Admitting: *Deleted

## 2023-07-15 DIAGNOSIS — I351 Nonrheumatic aortic (valve) insufficiency: Secondary | ICD-10-CM

## 2023-07-21 DIAGNOSIS — F419 Anxiety disorder, unspecified: Secondary | ICD-10-CM | POA: Diagnosis not present

## 2023-07-21 DIAGNOSIS — F330 Major depressive disorder, recurrent, mild: Secondary | ICD-10-CM | POA: Diagnosis not present

## 2023-08-18 DIAGNOSIS — F419 Anxiety disorder, unspecified: Secondary | ICD-10-CM | POA: Diagnosis not present

## 2023-08-18 DIAGNOSIS — F330 Major depressive disorder, recurrent, mild: Secondary | ICD-10-CM | POA: Diagnosis not present

## 2023-09-09 DIAGNOSIS — F330 Major depressive disorder, recurrent, mild: Secondary | ICD-10-CM | POA: Diagnosis not present

## 2023-09-09 DIAGNOSIS — I10 Essential (primary) hypertension: Secondary | ICD-10-CM | POA: Diagnosis not present

## 2023-09-09 DIAGNOSIS — I809 Phlebitis and thrombophlebitis of unspecified site: Secondary | ICD-10-CM | POA: Diagnosis not present

## 2023-09-09 DIAGNOSIS — K219 Gastro-esophageal reflux disease without esophagitis: Secondary | ICD-10-CM | POA: Diagnosis not present

## 2023-09-09 DIAGNOSIS — F419 Anxiety disorder, unspecified: Secondary | ICD-10-CM | POA: Diagnosis not present

## 2023-09-09 DIAGNOSIS — E7849 Other hyperlipidemia: Secondary | ICD-10-CM | POA: Diagnosis not present

## 2023-09-09 DIAGNOSIS — Z1329 Encounter for screening for other suspected endocrine disorder: Secondary | ICD-10-CM | POA: Diagnosis not present

## 2023-09-14 DIAGNOSIS — F419 Anxiety disorder, unspecified: Secondary | ICD-10-CM | POA: Diagnosis not present

## 2023-09-14 DIAGNOSIS — Z6829 Body mass index (BMI) 29.0-29.9, adult: Secondary | ICD-10-CM | POA: Diagnosis not present

## 2023-09-14 DIAGNOSIS — I10 Essential (primary) hypertension: Secondary | ICD-10-CM | POA: Diagnosis not present

## 2023-09-14 DIAGNOSIS — K219 Gastro-esophageal reflux disease without esophagitis: Secondary | ICD-10-CM | POA: Diagnosis not present

## 2023-09-14 DIAGNOSIS — Z23 Encounter for immunization: Secondary | ICD-10-CM | POA: Diagnosis not present

## 2023-09-14 DIAGNOSIS — J309 Allergic rhinitis, unspecified: Secondary | ICD-10-CM | POA: Diagnosis not present

## 2023-09-14 DIAGNOSIS — E7849 Other hyperlipidemia: Secondary | ICD-10-CM | POA: Diagnosis not present

## 2023-09-14 DIAGNOSIS — F330 Major depressive disorder, recurrent, mild: Secondary | ICD-10-CM | POA: Diagnosis not present

## 2023-09-14 DIAGNOSIS — E669 Obesity, unspecified: Secondary | ICD-10-CM | POA: Diagnosis not present

## 2023-09-27 ENCOUNTER — Other Ambulatory Visit: Payer: Self-pay | Admitting: Cardiovascular Disease

## 2023-09-28 DIAGNOSIS — L578 Other skin changes due to chronic exposure to nonionizing radiation: Secondary | ICD-10-CM | POA: Diagnosis not present

## 2023-09-28 DIAGNOSIS — D485 Neoplasm of uncertain behavior of skin: Secondary | ICD-10-CM | POA: Diagnosis not present

## 2023-09-28 DIAGNOSIS — Z85828 Personal history of other malignant neoplasm of skin: Secondary | ICD-10-CM | POA: Diagnosis not present

## 2023-09-28 DIAGNOSIS — D0421 Carcinoma in situ of skin of right ear and external auricular canal: Secondary | ICD-10-CM | POA: Diagnosis not present

## 2023-09-28 DIAGNOSIS — D225 Melanocytic nevi of trunk: Secondary | ICD-10-CM | POA: Diagnosis not present

## 2023-09-28 DIAGNOSIS — L570 Actinic keratosis: Secondary | ICD-10-CM | POA: Diagnosis not present

## 2023-09-28 DIAGNOSIS — L821 Other seborrheic keratosis: Secondary | ICD-10-CM | POA: Diagnosis not present

## 2023-10-19 DIAGNOSIS — Z20828 Contact with and (suspected) exposure to other viral communicable diseases: Secondary | ICD-10-CM | POA: Diagnosis not present

## 2023-10-19 DIAGNOSIS — R030 Elevated blood-pressure reading, without diagnosis of hypertension: Secondary | ICD-10-CM | POA: Diagnosis not present

## 2023-10-19 DIAGNOSIS — J069 Acute upper respiratory infection, unspecified: Secondary | ICD-10-CM | POA: Diagnosis not present

## 2023-10-19 DIAGNOSIS — J029 Acute pharyngitis, unspecified: Secondary | ICD-10-CM | POA: Diagnosis not present

## 2023-10-21 DIAGNOSIS — R030 Elevated blood-pressure reading, without diagnosis of hypertension: Secondary | ICD-10-CM | POA: Diagnosis not present

## 2023-10-21 DIAGNOSIS — J329 Chronic sinusitis, unspecified: Secondary | ICD-10-CM | POA: Diagnosis not present

## 2023-10-21 DIAGNOSIS — Z20828 Contact with and (suspected) exposure to other viral communicable diseases: Secondary | ICD-10-CM | POA: Diagnosis not present

## 2023-10-21 DIAGNOSIS — J40 Bronchitis, not specified as acute or chronic: Secondary | ICD-10-CM | POA: Diagnosis not present

## 2023-10-21 DIAGNOSIS — Z6829 Body mass index (BMI) 29.0-29.9, adult: Secondary | ICD-10-CM | POA: Diagnosis not present

## 2023-10-28 NOTE — Progress Notes (Unsigned)
Cardiology Office Note:    Date:  11/09/2023   ID:  Judy Mitchell, DOB March 13, 1951, MRN [id number]  PCP:  Royann Shivers, PA-C  Cardiologist:  Nanetta Batty, MD { Click to update primary MD,subspecialty MD or APP then REFRESH:1}    Referring MD: Royann Shivers, *   Chief Complaint: follow-up of moderate aortic insufficiency   History of Present Illness:    Judy Mitchell is a 72 y.o. female with a history of moderate aortic insufficiency, hypertension, hyperlipidemia, and hiatal hernia s/p Nissen fundoplication in 09/2018 who is followed by Dr. Allyson Sabal and presents today for routine follow-up.   Patient was referred to Dr. Allyson Sabal in 05/2018 for further evaluation of palpitations. Echo and 30 Day Event Monitor were ordered for further evaluation. Echo showed LVEF of 55-60% with inferobasal hypokinesis, mild to moderate AI, and moderate pulmonary hypertension. Event monitor showed normal sinus rhythm with no significant arrhythmias. Palpitations improved with treatment of anxiety and reducing caffeine intake. Myoview in 07/2018 showed no evidence of ischemia or infarction. She has had multiple Echos over the years for routine monitor of aortic valve. Echo in 06/2022 showed moderate to severe AI.  She was last seen by Dr. Allyson Sabal in 01/2023 at which time she reported mild to moderate shortness of breath but was overall doing well. Repeat Echo was ordered and completed in 06/2023 showing LVEF of 60-65% with global hypokinesis, mild asymmetric LVH of the basal septal segment, and grade 1 diastolic dysfunction as well as normal RV size and function with moderately elevated PASP and moderate AI.   Patient presents today for follow-up. Here alone. Patient reports continued dyspnea on exertion but states that this is stable. She thought her breathing may improved with losing weight but it has not. She thinks it is due to deconditioning. She denies any worsening dyspnea since last visit. No  shortness of breath at rest. No orthopnea, PND, or edema. No chest pain or palpitations. She has some lightheadedness/ dizziness with standing which sounds like orthostasis but no syncope.    EKGs/Labs/Other Studies Reviewed:    The following studies were reviewed:  Monitor 06/27/2018 to 07/26/2018: Normal sinus rhythm _______________  Myoview 08/23/2018: The left ventricular ejection fraction is normal (55-65%). Nuclear stress EF: 55%. Blood pressure demonstrated a normal response to exercise. There was no ST segment deviation noted during stress. The study is normal. This is a low risk study.   Normal stress nuclear study with no ischemia or infarction.  Gated ejection fraction 55% with normal wall motion. _______________  Echocardiogram 07/14/2023: Impressions: 1. Left ventricular ejection fraction, by estimation, is 60 to 65%. The  left ventricle has normal function. The left ventricle demonstrates global  hypokinesis. There is mild asymmetric left ventricular hypertrophy of the  basal-septal segment. Left  ventricular diastolic parameters are consistent with Grade I diastolic  dysfunction (impaired relaxation).   2. Right ventricular systolic function is normal. The right ventricular  size is normal. There is moderately elevated pulmonary artery systolic  pressure.   3. Left atrial size was mildly dilated.   4. The mitral valve is normal in structure. Trivial mitral valve  regurgitation. No evidence of mitral stenosis.   5. The aortic valve is tricuspid. There is mild calcification of the  aortic valve. Aortic valve regurgitation is moderate. Aortic valve  sclerosis/calcification is present, without any evidence of aortic  stenosis. Aortic regurgitation PHT measures 430  msec.   6. The inferior vena cava is normal in  size with greater than 50%  respiratory variability, suggesting right atrial pressure of 3 mmHg.  _______________  EKG:  EKG not ordered today.   Recent  Labs: No results found for requested labs within last 365 days.  Recent Lipid Panel    Component Value Date/Time   CHOL 159 03/18/2020 1001   TRIG 97 03/18/2020 1001   HDL 53 03/18/2020 1001   CHOLHDL 3.0 03/18/2020 1001   LDLCALC 88 03/18/2020 1001    Physical Exam:    Vital Signs: BP 126/62   Pulse 67   Ht 5\' 6"  (1.676 m)   Wt 177 lb (80.3 kg)   SpO2 96%   BMI 28.57 kg/m     Wt Readings from Last 3 Encounters:  11/09/23 177 lb (80.3 kg)  02/23/23 193 lb 12.8 oz (87.9 kg)  08/10/22 188 lb 12.8 oz (85.6 kg)     General: 72 y.o. female in no acute distress. HEENT: Normocephalic and atraumatic. Sclera clear.  Neck: Supple. No carotid bruits. No JVD. Heart: RRR. Distinct S1 and S2. No murmurs, gallops, or rubs.  Lungs: No increased work of breathing. Clear to ausculation bilaterally. No wheezes, rhonchi, or rales.  Extremities: No lower extremity edema.   Skin: Warm and dry. Neuro: No focal deficits. Psych: Normal affect. Responds appropriately.   Assessment:    1. Moderate aortic insufficiency   2. Palpitations   3. Primary hypertension   4. Hyperlipidemia, unspecified hyperlipidemia type     Plan:    Moderate Aortic Insufficiency Last Echo in 06/2023 showed LVEF of 60-65% with global hypokinesis, mild asymmetric LVH of the basal septal segment, and grade 1 diastolic dysfunction as well as normal RV size and function with moderately elevated PASP and moderate AI.  - She has some chronic dyspnea on exertion which is stable but no other signs or symptoms of CHF. - Plan is for repeat Echo in 06/2024.   Dyspnea on Exertion  Palpitations Event Monitor in 2019 showed normal sinus rhythm with no significant arrhythmias.  - Well controlled. No recent palpitations.  - Continue Toprol-XL 50mg  daily.   Hypertension BP well controlled.  - Continue current medications: Amlodipine 2.5mg  daily, Valsartan 160mg  daily, and Toprol-XL 50mg  daily.  - She does  ***  Hyperlipidemia Lipid panel in ***: - Continue Lipitor 20mg  daily.  - Labs followed by PCP.   Disposition: Follow up in ***   Signed, Corrin Parker, PA-C  11/09/2023 3:42 PM    [facility]

## 2023-11-09 ENCOUNTER — Ambulatory Visit: Payer: Medicare Other | Attending: Student | Admitting: Student

## 2023-11-09 ENCOUNTER — Encounter: Payer: Self-pay | Admitting: Student

## 2023-11-09 VITALS — BP 126/62 | HR 67 | Ht 66.0 in | Wt 177.0 lb

## 2023-11-09 DIAGNOSIS — I10 Essential (primary) hypertension: Secondary | ICD-10-CM | POA: Insufficient documentation

## 2023-11-09 DIAGNOSIS — R002 Palpitations: Secondary | ICD-10-CM | POA: Insufficient documentation

## 2023-11-09 DIAGNOSIS — E785 Hyperlipidemia, unspecified: Secondary | ICD-10-CM | POA: Insufficient documentation

## 2023-11-09 DIAGNOSIS — R0609 Other forms of dyspnea: Secondary | ICD-10-CM | POA: Diagnosis not present

## 2023-11-09 DIAGNOSIS — I351 Nonrheumatic aortic (valve) insufficiency: Secondary | ICD-10-CM | POA: Diagnosis not present

## 2023-11-09 NOTE — Patient Instructions (Signed)
Medication Instructions:   No Changes *If you need a refill on your cardiac medications before your next appointment, please call your pharmacy*   Lab Work: No labs If you have labs (blood work) drawn today and your tests are completely normal, you will receive your results only by: MyChart Message (if you have MyChart) OR A paper copy in the mail If you have any lab test that is abnormal or we need to change your treatment, we will call you to review the results.   Testing/Procedures: No Testing   Follow-Up: At [facility], you and your health needs are our priority.  As part of our continuing mission to provide you with exceptional heart care, we have created designated Provider Care Teams.  These Care Teams include your primary Cardiologist (physician) and Advanced Practice Providers (APPs -  Physician Assistants and Nurse Practitioners) who all work together to provide you with the care you need, when you need it.  We recommend signing up for the patient portal called "MyChart".  Sign up information is provided on this After Visit Summary.  MyChart is used to connect with patients for Virtual Visits (Telemedicine).  Patients are able to view lab/test results, encounter notes, upcoming appointments, etc.  Non-urgent messages can be sent to your provider as well.   To learn more about what you can do with MyChart, go to [email or web address].    Your next appointment:   6 month(s)  Provider:   Jonathan Berry, MD    

## 2023-11-10 ENCOUNTER — Encounter: Payer: Self-pay | Admitting: Student

## 2023-11-10 DIAGNOSIS — F330 Major depressive disorder, recurrent, mild: Secondary | ICD-10-CM | POA: Diagnosis not present

## 2023-11-10 DIAGNOSIS — F4321 Adjustment disorder with depressed mood: Secondary | ICD-10-CM | POA: Diagnosis not present

## 2023-12-27 ENCOUNTER — Other Ambulatory Visit: Payer: Self-pay | Admitting: Cardiovascular Disease

## 2024-02-21 DIAGNOSIS — Z1231 Encounter for screening mammogram for malignant neoplasm of breast: Secondary | ICD-10-CM | POA: Diagnosis not present

## 2024-03-20 DIAGNOSIS — Z6829 Body mass index (BMI) 29.0-29.9, adult: Secondary | ICD-10-CM | POA: Diagnosis not present

## 2024-03-20 DIAGNOSIS — J019 Acute sinusitis, unspecified: Secondary | ICD-10-CM | POA: Diagnosis not present

## 2024-03-20 DIAGNOSIS — R059 Cough, unspecified: Secondary | ICD-10-CM | POA: Diagnosis not present

## 2024-03-30 DIAGNOSIS — Z0000 Encounter for general adult medical examination without abnormal findings: Secondary | ICD-10-CM | POA: Diagnosis not present

## 2024-03-30 DIAGNOSIS — I10 Essential (primary) hypertension: Secondary | ICD-10-CM | POA: Diagnosis not present

## 2024-03-30 DIAGNOSIS — Z1389 Encounter for screening for other disorder: Secondary | ICD-10-CM | POA: Diagnosis not present

## 2024-03-30 DIAGNOSIS — R002 Palpitations: Secondary | ICD-10-CM | POA: Diagnosis not present

## 2024-03-30 DIAGNOSIS — Z0001 Encounter for general adult medical examination with abnormal findings: Secondary | ICD-10-CM | POA: Diagnosis not present

## 2024-03-30 DIAGNOSIS — Z1331 Encounter for screening for depression: Secondary | ICD-10-CM | POA: Diagnosis not present

## 2024-03-30 DIAGNOSIS — F330 Major depressive disorder, recurrent, mild: Secondary | ICD-10-CM | POA: Diagnosis not present

## 2024-03-30 DIAGNOSIS — Z6829 Body mass index (BMI) 29.0-29.9, adult: Secondary | ICD-10-CM | POA: Diagnosis not present

## 2024-03-30 DIAGNOSIS — Z1329 Encounter for screening for other suspected endocrine disorder: Secondary | ICD-10-CM | POA: Diagnosis not present

## 2024-03-30 DIAGNOSIS — Z9189 Other specified personal risk factors, not elsewhere classified: Secondary | ICD-10-CM | POA: Diagnosis not present

## 2024-03-30 DIAGNOSIS — E785 Hyperlipidemia, unspecified: Secondary | ICD-10-CM | POA: Diagnosis not present

## 2024-03-30 DIAGNOSIS — R5383 Other fatigue: Secondary | ICD-10-CM | POA: Diagnosis not present

## 2024-04-13 DIAGNOSIS — L821 Other seborrheic keratosis: Secondary | ICD-10-CM | POA: Diagnosis not present

## 2024-04-13 DIAGNOSIS — L570 Actinic keratosis: Secondary | ICD-10-CM | POA: Diagnosis not present

## 2024-04-13 DIAGNOSIS — C44519 Basal cell carcinoma of skin of other part of trunk: Secondary | ICD-10-CM | POA: Diagnosis not present

## 2024-04-13 DIAGNOSIS — D485 Neoplasm of uncertain behavior of skin: Secondary | ICD-10-CM | POA: Diagnosis not present

## 2024-04-13 DIAGNOSIS — Z85828 Personal history of other malignant neoplasm of skin: Secondary | ICD-10-CM | POA: Diagnosis not present

## 2024-04-13 DIAGNOSIS — C44319 Basal cell carcinoma of skin of other parts of face: Secondary | ICD-10-CM | POA: Diagnosis not present

## 2024-05-14 ENCOUNTER — Other Ambulatory Visit: Payer: Self-pay | Admitting: Cardiovascular Disease

## 2024-05-14 DIAGNOSIS — R002 Palpitations: Secondary | ICD-10-CM

## 2024-05-29 ENCOUNTER — Other Ambulatory Visit: Payer: Self-pay | Admitting: Cardiovascular Disease

## 2024-07-06 ENCOUNTER — Ambulatory Visit (HOSPITAL_COMMUNITY)
Admission: RE | Admit: 2024-07-06 | Discharge: 2024-07-06 | Disposition: A | Discharge status: Home / Self Care | Source: Ambulatory Visit | Attending: Cardiovascular Disease | Admitting: Cardiovascular Disease

## 2024-07-06 DIAGNOSIS — I351 Nonrheumatic aortic (valve) insufficiency: Secondary | ICD-10-CM | POA: Insufficient documentation

## 2024-07-06 LAB — ECHOCARDIOGRAM COMPLETE
Area-P 1/2: 4.86 cm2
P 1/2 time: 316 ms
S' Lateral: 3 cm

## 2024-07-07 ENCOUNTER — Telehealth: Payer: Self-pay | Admitting: Cardiovascular Disease

## 2024-07-07 NOTE — Telephone Encounter (Signed)
 Pt is requesting a callback regarding her wanting to discuss her Echo results. Please advise

## 2024-07-07 NOTE — Telephone Encounter (Signed)
 Pt called for results of echo. Not read by provider yet and pt advised that she will be contacted once read by the provider.

## 2024-07-09 ENCOUNTER — Ambulatory Visit: Payer: Self-pay | Admitting: Cardiovascular Disease

## 2024-07-09 DIAGNOSIS — I10 Essential (primary) hypertension: Secondary | ICD-10-CM

## 2024-07-09 DIAGNOSIS — E782 Mixed hyperlipidemia: Secondary | ICD-10-CM

## 2024-07-09 DIAGNOSIS — I351 Nonrheumatic aortic (valve) insufficiency: Secondary | ICD-10-CM

## 2024-07-24 DIAGNOSIS — N898 Other specified noninflammatory disorders of vagina: Secondary | ICD-10-CM | POA: Diagnosis not present

## 2024-08-02 ENCOUNTER — Telehealth: Payer: Self-pay | Admitting: Cardiovascular Disease

## 2024-08-02 NOTE — Telephone Encounter (Signed)
 The patient has been notified of Echocardiogram result and verbalized understanding.  All questions (if any) were answered. Hamp LOISE Norrie, RN 08/02/2024 2:15 PM

## 2024-08-02 NOTE — Telephone Encounter (Signed)
Patient calling in about her echo results. Please advise

## 2024-08-08 ENCOUNTER — Encounter: Payer: Self-pay | Admitting: Cardiovascular Disease

## 2024-08-08 ENCOUNTER — Ambulatory Visit: Attending: Cardiovascular Disease | Admitting: Cardiovascular Disease

## 2024-08-08 VITALS — BP 134/72 | HR 69 | Ht 66.0 in | Wt 192.6 lb

## 2024-08-08 DIAGNOSIS — R002 Palpitations: Secondary | ICD-10-CM | POA: Diagnosis not present

## 2024-08-08 DIAGNOSIS — I351 Nonrheumatic aortic (valve) insufficiency: Secondary | ICD-10-CM | POA: Insufficient documentation

## 2024-08-08 DIAGNOSIS — I10 Essential (primary) hypertension: Secondary | ICD-10-CM | POA: Diagnosis not present

## 2024-08-08 DIAGNOSIS — E782 Mixed hyperlipidemia: Secondary | ICD-10-CM | POA: Diagnosis not present

## 2024-08-08 NOTE — Assessment & Plan Note (Signed)
 History of essential hypertension blood pressure measured today at 134/72.  She is on amlodipine , metoprolol  and valsartan .

## 2024-08-08 NOTE — Progress Notes (Signed)
 08/08/2024 Judy Mitchell   [birth year]  [id number]  Primary Physician Jeanette Comer BRAVO, PA-C Primary Cardiologist: Dorn JINNY Lesches MD [place] [place], [place]  HPI:  Judy Mitchell is a 73 y.o.  married Caucasian female no children who is retired from working at the [employer].  She was referred by Judy Skillman, PA-C for evaluation of PACs.  I last saw her in the office 02/23/2023. She has no prior cardiac history.  Her only risk factors include treated hypertension.  She is never had a heart attack or stroke.  She denies chest pain or shortness of breath.  She does have a hiatal hernia with GERD on omeprazole  followed by Dr. Frederic   She noticed new onset palpitations approximately 6 months ago occurring in the early morning hours awaking feeling her from sleep frequently at around 4 AM .   There are no other associated symptoms.  They are self-limited.  She has limited caffeine and chocolate from her diet.  Her thyroid  function tests have been normal.   She was under a lot of stress at home with an ailing husband.  Her palpitations have improved with the addition of Prozac, Xanax and elimination of caffeine.  A 2D echo was performed that showed normal LV systolic function, mild LV dilatation with mild to moderate AI and mild to moderate pulmonary hypertension.  She underwent successful laparoscopic Nissen fundoplication by Dr. Adina Mitchell which resulted in improvement in her reflux symptoms.    She unfortunately lost her mother this past summer for metastatic melanoma. She describes her as her best friend. She has noticed increased frequency and severity of palpitations in the last several weeks without identifiable cause. These had improved with abstaining from caffeine intake, anxiety lytics and low-dose beta-blocker.   Since I saw her in the office a year and a half ago she continues to do well.  She is worried about her inability to lose weight.  She still has some  mild dyspnea.  She denies chest pain.  Her most recent 2D echo performed 07/06/2024 was unchanged with moderate aortic insufficiency and normal LV systolic function..   Current Meds  Medication Sig   ALPRAZolam (XANAX) 0.25 MG tablet Take 0.125 mg by mouth 2 (two) times daily as needed for anxiety.    amLODipine  (NORVASC ) 2.5 MG tablet TAKE 1 TABLET BY MOUTH DAILY   atorvastatin  (LIPITOR) 20 MG tablet Take 1 tablet (20 mg total) by mouth daily.   FLUoxetine (PROZAC) 20 MG capsule Take 20 mg by mouth daily.   gabapentin  (NEURONTIN ) 100 MG capsule Take 100 mg by mouth at bedtime as needed.   metoprolol  succinate (TOPROL -XL) 50 MG 24 hr tablet TAKE 1 TABLET BY MOUTH EVERY DAY   phentermine (ADIPEX-P) 37.5 MG tablet Take 37.5 mg by mouth.   valsartan  (DIOVAN ) 160 MG tablet Take 1 tablet (160 mg total) by mouth daily.     Allergies  Allergen Reactions   Sulfa Antibiotics Itching and Rash    Social History   Socioeconomic History   Marital status: Married    Spouse name: Not on file   Number of children: Not on file   Years of education: Not on file   Highest education level: Not on file  Occupational History   Not on file  Tobacco Use   Smoking status: Never   Smokeless tobacco: Never  Vaping Use   Vaping status: Never Used  Substance and Sexual Activity   Alcohol   use: Yes    Comment: rarely   Drug use: No   Sexual activity: Not on file  Other Topics Concern   Not on file  Social History Narrative   Not on file   Social Drivers of Health   Financial Resource Strain: Low Risk  (02/23/2023)   Overall Financial Resource Strain (CARDIA)    Difficulty of Paying Living Expenses: Not very hard  Food Insecurity: No Food Insecurity (07/24/2024)   [facility]   Hunger Vital Sign    Within the past 12 months, you worried that your food would run out before you got the money to buy more.: Never true    Within the past 12 months, the food you bought just didn't last  and you didn't have money to get more.: Never true  Transportation Needs: No Transportation Needs (07/24/2024)   Received from [facility]   PRAPARE - Transportation    Lack of Transportation (Medical): No    Lack of Transportation (Non-Medical): No  Physical Activity: Not on file  Stress: Stress Concern Present (02/23/2023)   [employer] of Occupational Health - Occupational Stress Questionnaire    Feeling of Stress : To some extent  Social Connections: Not on file  Intimate Partner Violence: Not At Risk (11/30/2022)   Received from [facility]   Humiliation, Afraid, Rape, and Kick questionnaire    Within the last year, have you been afraid of your partner or ex-partner?: No    Within the last year, have you been humiliated or emotionally abused in other ways by your partner or ex-partner?: No    Within the last year, have you been kicked, hit, slapped, or otherwise physically hurt by your partner or ex-partner?: No    Within the last year, have you been raped or forced to have any kind of sexual activity by your partner or ex-partner?: No     Review of Systems: General: negative for chills, fever, night sweats or weight changes.  Cardiovascular: negative for chest pain, dyspnea on exertion, edema, orthopnea, palpitations, paroxysmal nocturnal dyspnea or shortness of breath Dermatological: negative for rash Respiratory: negative for cough or wheezing Urologic: negative for hematuria Abdominal: negative for nausea, vomiting, diarrhea, bright red blood per rectum, melena, or hematemesis Neurologic: negative for visual changes, syncope, or dizziness All other systems reviewed and are otherwise negative except as noted above.    Blood pressure 134/72, pulse 69, height 5' 6 (1.676 m), weight 192 lb 9.6 oz (87.4 kg), SpO2 94%.  General appearance: alert and no distress Neck: no adenopathy, no carotid bruit, no JVD, supple, symmetrical, trachea midline, and thyroid  not  enlarged, symmetric, no tenderness/mass/nodules Lungs: clear to auscultation bilaterally Heart: regular rate and rhythm, S1, S2 normal, no murmur, click, rub or gallop Extremities: extremities normal, atraumatic, no cyanosis or edema Pulses: 2+ and symmetric Skin: Skin color, texture, turgor normal. No rashes or lesions Neurologic: Grossly normal  EKG      ASSESSMENT AND PLAN:   Essential hypertension History of essential hypertension blood pressure measured today at 134/72.  She is on amlodipine , metoprolol  and valsartan .  Hyperlipidemia History of hyperlipidemia on statin therapy with lipid profile performed 09/10/2023 revealing total cholesterol 187, LDL of 93 and HDL 46.  Aortic insufficiency History of moderate aortic insufficiency with recent 2D echo performed 07/06/2024 revealing EF of 60 to 65%, grade 1 diastolic dysfunction with moderate aortic insufficiency unchanged from prior echoes.  Patient is minimally symptomatic with some dyspnea which  has not changed.  Will repeat in 12 months.     Dorn DOROTHA Lesches MD FACP,FACC,FAHA, [facility] 08/08/2024 1:39 PM

## 2024-08-08 NOTE — Patient Instructions (Signed)
 Medication Instructions:  Your physician recommends that you continue on your current medications as directed. Please refer to the Current Medication list given to you today.  *If you need a refill on your cardiac medications before your next appointment, please call your pharmacy*  Testing/Procedures: Your physician has requested that you have an echocardiogram. Echocardiography is a painless test that uses sound waves to create images of your heart. It provides your doctor with information about the size and shape of your heart and how well your heart's chambers and valves are working. This procedure takes approximately one hour. There are no restrictions for this procedure. Please do NOT wear cologne, perfume, aftershave, or lotions (deodorant is allowed). Please arrive 15 minutes prior to your appointment time.  Please note: We ask at that you not bring children with you during ultrasound (echo/ vascular) testing. Due to room size and safety concerns, children are not allowed in the ultrasound rooms during exams. Our front office staff cannot provide observation of children in our lobby area while testing is being conducted. An adult accompanying a patient to their appointment will only be allowed in the ultrasound room at the discretion of the ultrasound technician under special circumstances. We apologize for any inconvenience. **To do in July 2026**   Follow-Up: At [facility], you and your health needs are our priority.  As part of our continuing mission to provide you with exceptional heart care, our providers are all part of one team.  This team includes your primary Cardiologist (physician) and Advanced Practice Providers or APPs (Physician Assistants and Nurse Practitioners) who all work together to provide you with the care you need, when you need it.  Your next appointment:   12 month(s)  Provider:   Dorn Lesches, MD    We recommend signing up for the patient portal  called MyChart.  Sign up information is provided on this After Visit Summary.  MyChart is used to connect with patients for Virtual Visits (Telemedicine).  Patients are able to view lab/test results, encounter notes, upcoming appointments, etc.  Non-urgent messages can be sent to your provider as well.   To learn more about what you can do with MyChart, go to [email or web address].

## 2024-08-08 NOTE — Assessment & Plan Note (Signed)
 History of moderate aortic insufficiency with recent 2D echo performed 07/06/2024 revealing EF of 60 to 65%, grade 1 diastolic dysfunction with moderate aortic insufficiency unchanged from prior echoes.  Patient is minimally symptomatic with some dyspnea which has not changed.  Will repeat in 12 months.

## 2024-08-08 NOTE — Assessment & Plan Note (Signed)
 History of hyperlipidemia on statin therapy with lipid profile performed 09/10/2023 revealing total cholesterol 187, LDL of 93 and HDL 46.

## 2024-08-08 NOTE — Patient Instructions (Signed)
 Medication Instructions:  Your physician recommends that you continue on your current medications as directed. Please refer to the Current Medication list given to you today.  *If you need a refill on your cardiac medications before your next appointment, please call your pharmacy*   Testing/Procedures: Your physician has requested that you have an echocardiogram. Echocardiography is a painless test that uses sound waves to create images of your heart. It provides your doctor with information about the size and shape of your heart and how well your heart's chambers and valves are working. This procedure takes approximately one hour. There are no restrictions for this procedure. Please do NOT wear cologne, perfume, aftershave, or lotions (deodorant is allowed). Please arrive 15 minutes prior to your appointment time.  Please note: We ask at that you not bring children with you during ultrasound (echo/ vascular) testing. Due to room size and safety concerns, children are not allowed in the ultrasound rooms during exams. Our front office staff cannot provide observation of children in our lobby area while testing is being conducted. An adult accompanying a patient to their appointment will only be allowed in the ultrasound room at the discretion of the ultrasound technician under special circumstances. We apologize for any inconvenience. **To do in July 2026**   Follow-Up: At [facility], you and your health needs are our priority.  As part of our continuing mission to provide you with exceptional heart care, our providers are all part of one team.  This team includes your primary Cardiologist (physician) and Advanced Practice Providers or APPs (Physician Assistants and Nurse Practitioners) who all work together to provide you with the care you need, when you need it.  Your next appointment:   12 month(s)  Provider:   Dorn Lesches, MD    We recommend signing up for the patient portal  called MyChart.  Sign up information is provided on this After Visit Summary.  MyChart is used to connect with patients for Virtual Visits (Telemedicine).  Patients are able to view lab/test results, encounter notes, upcoming appointments, etc.  Non-urgent messages can be sent to your provider as well.   To learn more about what you can do with MyChart, go to [email or web address].

## 2024-08-18 ENCOUNTER — Encounter: Payer: Self-pay | Admitting: Radiology

## 2024-08-22 DIAGNOSIS — N760 Acute vaginitis: Secondary | ICD-10-CM | POA: Diagnosis not present

## 2024-09-01 ENCOUNTER — Telehealth: Payer: Self-pay | Admitting: Cardiovascular Disease

## 2024-09-01 NOTE — Telephone Encounter (Signed)
 Called patient back about message. Patient complaining of SOB that has gotten worse since yesterday. Patient stated she was deep cleaning her kitchen, and after she finished and stopped, she found herself SOB. Patient stated the SOB continued with rest, and it took 4 to 5 hours for it to finally settle down. Patient stated stated she feels like she cannot get enough air with activity, and having wheezing with activity. Patient stated she has some pitting edema BLE from her feet to almost her calf.  Patient reporting keeping a low salt diet. Encouraged patient that she needs to get evaluated that she can go to the ED or call 911 if symptoms return or get worse. Made patient an appointment with Dr. Court on Monday also.

## 2024-09-01 NOTE — Telephone Encounter (Signed)
 Pt c/o Shortness Of Breath: STAT if SOB developed within the last 24 hours or pt is noticeably SOB on the phone  1. Are you currently SOB (can you hear that pt is SOB on the phone)? No   2. How long have you been experiencing SOB? 2 days   3. Are you SOB when sitting or when up moving around? Moving around   4. Are you currently experiencing any other symptoms? No

## 2024-09-03 DIAGNOSIS — Z6830 Body mass index (BMI) 30.0-30.9, adult: Secondary | ICD-10-CM | POA: Diagnosis not present

## 2024-09-03 DIAGNOSIS — R0602 Shortness of breath: Secondary | ICD-10-CM | POA: Diagnosis not present

## 2024-09-03 DIAGNOSIS — I2699 Other pulmonary embolism without acute cor pulmonale: Secondary | ICD-10-CM | POA: Diagnosis not present

## 2024-09-03 DIAGNOSIS — I351 Nonrheumatic aortic (valve) insufficiency: Secondary | ICD-10-CM | POA: Diagnosis not present

## 2024-09-03 DIAGNOSIS — J9601 Acute respiratory failure with hypoxia: Secondary | ICD-10-CM | POA: Diagnosis not present

## 2024-09-03 DIAGNOSIS — Z79899 Other long term (current) drug therapy: Secondary | ICD-10-CM | POA: Diagnosis not present

## 2024-09-03 DIAGNOSIS — D72829 Elevated white blood cell count, unspecified: Secondary | ICD-10-CM | POA: Diagnosis not present

## 2024-09-03 DIAGNOSIS — Z7982 Long term (current) use of aspirin: Secondary | ICD-10-CM | POA: Diagnosis not present

## 2024-09-03 DIAGNOSIS — E785 Hyperlipidemia, unspecified: Secondary | ICD-10-CM | POA: Diagnosis not present

## 2024-09-03 DIAGNOSIS — Z86718 Personal history of other venous thrombosis and embolism: Secondary | ICD-10-CM | POA: Diagnosis not present

## 2024-09-03 DIAGNOSIS — I824Y2 Acute embolism and thrombosis of unspecified deep veins of left proximal lower extremity: Secondary | ICD-10-CM | POA: Diagnosis not present

## 2024-09-03 DIAGNOSIS — R079 Chest pain, unspecified: Secondary | ICD-10-CM | POA: Diagnosis not present

## 2024-09-03 DIAGNOSIS — I82492 Acute embolism and thrombosis of other specified deep vein of left lower extremity: Secondary | ICD-10-CM | POA: Diagnosis not present

## 2024-09-03 DIAGNOSIS — R531 Weakness: Secondary | ICD-10-CM | POA: Diagnosis not present

## 2024-09-03 DIAGNOSIS — R9431 Abnormal electrocardiogram [ECG] [EKG]: Secondary | ICD-10-CM | POA: Diagnosis not present

## 2024-09-03 DIAGNOSIS — I82412 Acute embolism and thrombosis of left femoral vein: Secondary | ICD-10-CM | POA: Diagnosis not present

## 2024-09-03 DIAGNOSIS — M7989 Other specified soft tissue disorders: Secondary | ICD-10-CM | POA: Diagnosis not present

## 2024-09-03 DIAGNOSIS — Z882 Allergy status to sulfonamides status: Secondary | ICD-10-CM | POA: Diagnosis not present

## 2024-09-03 DIAGNOSIS — R791 Abnormal coagulation profile: Secondary | ICD-10-CM | POA: Diagnosis not present

## 2024-09-03 DIAGNOSIS — I10 Essential (primary) hypertension: Secondary | ICD-10-CM | POA: Diagnosis not present

## 2024-09-03 DIAGNOSIS — E669 Obesity, unspecified: Secondary | ICD-10-CM | POA: Diagnosis not present

## 2024-09-03 DIAGNOSIS — I82401 Acute embolism and thrombosis of unspecified deep veins of right lower extremity: Secondary | ICD-10-CM | POA: Diagnosis not present

## 2024-09-03 DIAGNOSIS — I82402 Acute embolism and thrombosis of unspecified deep veins of left lower extremity: Secondary | ICD-10-CM | POA: Diagnosis not present

## 2024-09-04 ENCOUNTER — Ambulatory Visit: Admitting: Cardiovascular Disease

## 2024-09-04 DIAGNOSIS — I824Y2 Acute embolism and thrombosis of unspecified deep veins of left proximal lower extremity: Secondary | ICD-10-CM | POA: Diagnosis not present

## 2024-09-04 DIAGNOSIS — R0609 Other forms of dyspnea: Secondary | ICD-10-CM | POA: Diagnosis not present

## 2024-09-04 DIAGNOSIS — E785 Hyperlipidemia, unspecified: Secondary | ICD-10-CM | POA: Diagnosis not present

## 2024-09-04 DIAGNOSIS — I10 Essential (primary) hypertension: Secondary | ICD-10-CM | POA: Diagnosis not present

## 2024-09-04 DIAGNOSIS — J9601 Acute respiratory failure with hypoxia: Secondary | ICD-10-CM | POA: Diagnosis not present

## 2024-09-04 DIAGNOSIS — I2699 Other pulmonary embolism without acute cor pulmonale: Secondary | ICD-10-CM | POA: Diagnosis not present

## 2024-09-04 DIAGNOSIS — E669 Obesity, unspecified: Secondary | ICD-10-CM | POA: Diagnosis not present

## 2024-09-06 ENCOUNTER — Telehealth: Payer: Self-pay | Admitting: Cardiovascular Disease

## 2024-09-06 NOTE — Telephone Encounter (Signed)
 Pt inquiring about care for her recent blood clot diagnosis. Also interested in a provider change to be seen closer to home ([place]). Please advise.

## 2024-09-06 NOTE — Telephone Encounter (Signed)
 Advised patient that Dr. Court was okay with her transferring care to a provider in [place] or [place]. Patient does not have a preference on which provider. Will send to scheduling to arrange follow up.

## 2024-09-06 NOTE — Telephone Encounter (Signed)
 Patient states that she was recently in the hospital with a PE and DVT. She was discharged on Eliquis 10 mg twice daily for two weeks, then Eliquis 5 mg twice daily thereafter. She wanted to make us  aware and see if any follow up with our office is needed. She would like to know if she needs to continue to see Dr. Court or if she can switch to a provider in [place].

## 2024-09-08 ENCOUNTER — Telehealth: Payer: Self-pay | Admitting: Internal Medicine

## 2024-09-08 NOTE — Telephone Encounter (Signed)
 09/07/2024 no answer - patient needs to know that Dr. Mallipeddi will accept her as a patient in [facility]. Not sure when she needs to be seen again

## 2024-09-18 ENCOUNTER — Encounter: Payer: Self-pay | Admitting: Internal Medicine

## 2024-09-18 ENCOUNTER — Ambulatory Visit: Attending: Internal Medicine | Admitting: Internal Medicine

## 2024-09-18 VITALS — BP 114/62 | HR 64 | Ht 66.0 in | Wt 194.8 lb

## 2024-09-18 DIAGNOSIS — R002 Palpitations: Secondary | ICD-10-CM | POA: Diagnosis not present

## 2024-09-18 DIAGNOSIS — I351 Nonrheumatic aortic (valve) insufficiency: Secondary | ICD-10-CM | POA: Diagnosis not present

## 2024-09-18 DIAGNOSIS — I8290 Acute embolism and thrombosis of unspecified vein: Secondary | ICD-10-CM | POA: Insufficient documentation

## 2024-09-18 DIAGNOSIS — I272 Pulmonary hypertension, unspecified: Secondary | ICD-10-CM | POA: Diagnosis not present

## 2024-09-18 MED ORDER — FUROSEMIDE 20 MG PO TABS
20.0000 mg | ORAL_TABLET | Freq: Every day | ORAL | 2 refills | Status: DC
Start: 2024-09-18 — End: 2024-11-15

## 2024-09-18 MED ORDER — METOPROLOL SUCCINATE ER 25 MG PO TB24: 25.0000 mg | ORAL_TABLET | Freq: Every day | ORAL | 2 refills | Status: DC

## 2024-09-18 NOTE — Patient Instructions (Addendum)
 Medication Instructions:  Your physician has recommended you make the following change in your medication:  Decrease Metoprolol  Succinate from 50 mg to 25 mg once daily Start taking Lasix  20 mg once daily  Continue taking all other medications as prescribed  Labwork: None  Testing/Procedures: Your physician has requested that you have an echocardiogram in 6 months. Echocardiography is a painless test that uses sound waves to create images of your heart. It provides your doctor with information about the size and shape of your heart and how well your heart's chambers and valves are working. This procedure takes approximately one hour. There are no restrictions for this procedure. Please do NOT wear cologne, perfume, aftershave, or lotions (deodorant is allowed). Please arrive 15 minutes prior to your appointment time.  Please note: We ask at that you not bring children with you during ultrasound (echo/ vascular) testing. Due to room size and safety concerns, children are not allowed in the ultrasound rooms during exams. Our front office staff cannot provide observation of children in our lobby area while testing is being conducted. An adult accompanying a patient to their appointment will only be allowed in the ultrasound room at the discretion of the ultrasound technician under special circumstances. We apologize for any inconvenience.   Follow-Up: Your physician recommends that you schedule a follow-up appointment in: 6 months  Any Other Special Instructions Will Be Listed Below (If Applicable).  If you need a refill on your cardiac medications before your next appointment, please call your pharmacy.

## 2024-09-18 NOTE — Progress Notes (Signed)
 Cardiology Office Note  Date: 09/18/2024   ID: Judy, Mitchell [birth year], MRN [id number]  PCP:  Jeanette Comer BRAVO, PA-C  Cardiologist:  Dorn Lesches, MD Electrophysiologist:  None   History of Present Illness: Judy Mitchell is a 73 y.o. female  Prior patient of Dr. Lesches.  Moderate eccentric AI on echocardiogram from July 2025.  She has chronic DOE for the last couple of years and attributes this to deconditioning as she gained weight after she lost her mother (who was her best friend) done 4 years ago.  However in the last few months preceding acute DVT of LLE/acute bilateral PE in 08/2024, DOE was more worse and she noticed new left lower EXTR swelling.  She also reported that she was more sedentary in these months compared to before.  No angina, dizziness, lightheadedness, syncope.  She has minimal leg swelling in the left leg.  No leg swelling in the right leg.  Past Medical History:  Diagnosis Date   Aortic insufficiency    per ECHO 06-27-18 epic    Family history of adverse reaction to anesthesia    nausea, vomitting   Hiatal hernia 04/13/2017   High blood pressure 02/23/2017   HLD (hyperlipidemia)    Palpitations    PONV (postoperative nausea and vomiting)     Past Surgical History:  Procedure Laterality Date   ABDOMINAL HYSTERECTOMY     cataract surgery  Bilateral 10/2016   CHOLECYSTECTOMY     complete hysterectomy     endometriosis surgery     ESOPHAGOGASTRODUODENOSCOPY N/A 05/26/2017   Procedure: ESOPHAGOGASTRODUODENOSCOPY (EGD);  Surgeon: Golda Claudis PENNER, MD;  Location: [facility];  Service: Endoscopy;  Laterality: N/A;  2:10   HIATAL HERNIA REPAIR N/A 10/03/2018   Procedure: LAPAROSCOPIC REPAIR OF HIATAL HERNIA WITH GASTROPEXY, UPPER ENDOSCOPY;  Surgeon: Gladis Cough, MD;  Location: [facility] ORS;  Service: General;  Laterality: N/A;   KNEE SURGERY Right    TONSILLECTOMY      Current Outpatient Medications  Medication Sig Dispense Refill   ALPRAZolam  (XANAX) 0.25 MG tablet Take 0.125 mg by mouth 2 (two) times daily as needed for anxiety.      amLODipine  (NORVASC ) 2.5 MG tablet TAKE 1 TABLET BY MOUTH DAILY 90 tablet 0   apixaban (ELIQUIS) 5 MG TABS tablet Take 5 mg by mouth 2 (two) times daily.     FLUoxetine (PROZAC) 20 MG capsule Take 20 mg by mouth daily.     furosemide  (LASIX ) 20 MG tablet Take 1 tablet (20 mg total) by mouth daily. 90 tablet 2   gabapentin  (NEURONTIN ) 100 MG capsule Take 100 mg by mouth at bedtime as needed.     metoprolol  succinate (TOPROL  XL) 25 MG 24 hr tablet Take 1 tablet (25 mg total) by mouth daily. 90 tablet 2   valsartan  (DIOVAN ) 160 MG tablet Take 1 tablet (160 mg total) by mouth daily. 90 tablet 3   atorvastatin  (LIPITOR) 20 MG tablet Take 1 tablet (20 mg total) by mouth daily. (Patient not taking: Reported on 09/18/2024) 90 tablet 3   No current facility-administered medications for this visit.   Allergies:  Sulfa antibiotics   Social History: The patient  reports that she has never smoked. She has never used smokeless tobacco. She reports current alcohol  use. She reports that she does not use drugs.   Family History: The patient's family history includes Atrial fibrillation in her father; Heart attack in her sister; Parkinson's disease in her father, maternal  grandmother, and paternal grandmother; Ulcers in her sister.   ROS:  Please see the history of present illness. Otherwise, complete review of systems is positive for none  All other systems are reviewed and negative.   Physical Exam: VS:  BP 114/62   Pulse 64   Ht 5' 6 (1.676 m)   Wt 194 lb 12.8 oz (88.4 kg)   SpO2 95%   BMI 31.44 kg/m , BMI Body mass index is 31.44 kg/m.  Wt Readings from Last 3 Encounters:  09/18/24 194 lb 12.8 oz (88.4 kg)  08/08/24 192 lb 9.6 oz (87.4 kg)  11/09/23 177 lb (80.3 kg)    General: Patient appears comfortable at rest. HEENT: Conjunctiva and lids normal, oropharynx clear with moist mucosa. Neck: Supple, no  elevated JVP or carotid bruits, no thyromegaly. Lungs: Clear to auscultation, nonlabored breathing at rest. Cardiac: Regular rate and rhythm, no S3 or significant systolic murmur, no pericardial rub. Abdomen: Soft, nontender, no hepatomegaly, bowel sounds present, no guarding or rebound. Extremities: No pitting edema, distal pulses 2+. Skin: Warm and dry. Musculoskeletal: No kyphosis. Neuropsychiatric: Alert and oriented x3, affect grossly appropriate.  Recent Labwork: No results found for requested labs within last 365 days.     Component Value Date/Time   CHOL 159 03/18/2020 1001   TRIG 97 03/18/2020 1001   HDL 53 03/18/2020 1001   CHOLHDL 3.0 03/18/2020 1001   LDLCALC 88 03/18/2020 1001    Assessment and Plan:  Moderate eccentric aortic regurgitation in July 2025: Chronic DOE.  Decrease metoprolol  succinate from 50 mg to 25 mg once daily and start p.o. Lasix  20 mg once daily.  Repeat echocardiogram in 6 months due to eccentric AI.  Venous thromboembolism: Recent hospitalization at [facility] in September 2025, diagnosed with acute DVT in left lower extremity and acute bilateral pulmonary embolism with no evidence of right heart strain on CT. [facility] team evaluated her and deemed this to be unprovoked pulmonary embolism and recommended 37-month systemic anticoagulant therapy.  Patient reported she was more sedentary in the months preceding acute DVT/PE, likely provoked DVT/PE.  This was her first episode ever.  Moderate pulmonary hypertension: Moderate pulmonary hypertension was noted on echocardiogram performed in July 2024.  If she continues to experience dyspnea exertion despite completing 6 months therapy of systemic anticoagulation, echocardiogram will be repeated.  If she continues to have pulmonary hypertension, she needs VQ scan to r/o CTEPH.   40 minutes spent in reviewing prior records, imaging, test/reports, discussion of the above problems, answered all her  questions and documentation.  20 minutes spent in answering the questions.     Medication Adjustments/Labs and Tests Ordered: Current medicines are reviewed at length with the patient today.  Concerns regarding medicines are outlined above.    Disposition:  Follow up 6 months  Signed Zebastian Carico Priya Cirilo Canner, MD, 09/18/2024 11:09 AM    [facility] [address] [place], [place], [place] [address]

## 2024-09-22 ENCOUNTER — Other Ambulatory Visit: Payer: Self-pay | Admitting: Cardiovascular Disease

## 2024-09-28 ENCOUNTER — Other Ambulatory Visit (HOSPITAL_BASED_OUTPATIENT_CLINIC_OR_DEPARTMENT_OTHER): Payer: Self-pay

## 2024-09-28 ENCOUNTER — Telehealth: Payer: Self-pay | Admitting: Internal Medicine

## 2024-09-28 ENCOUNTER — Other Ambulatory Visit: Payer: Self-pay | Admitting: *Deleted

## 2024-09-28 DIAGNOSIS — Z6831 Body mass index (BMI) 31.0-31.9, adult: Secondary | ICD-10-CM | POA: Diagnosis not present

## 2024-09-28 DIAGNOSIS — H1131 Conjunctival hemorrhage, right eye: Secondary | ICD-10-CM | POA: Diagnosis not present

## 2024-09-28 MED ORDER — APIXABAN 5 MG PO TABS
5.0000 mg | ORAL_TABLET | Freq: Two times a day (BID) | ORAL | 3 refills | Status: AC
  Filled 2024-09-28: qty 180, 90d supply, fill #0
  Filled 2025-01-12: qty 60, 30d supply, fill #1

## 2024-09-28 NOTE — Telephone Encounter (Signed)
 Patient notified and verbalized understanding.

## 2024-09-28 NOTE — Telephone Encounter (Signed)
 Patient says she has bilateral blood clotting in lungs and her right eye is blood shot red. Tuesday it was pink. Wednesday is was a little red. She says today it looks like she is ready for halloween. She says her vision is fine and she is not in any pain. She is also on Eliquis. Please advise.

## 2024-10-04 DIAGNOSIS — E049 Nontoxic goiter, unspecified: Secondary | ICD-10-CM | POA: Diagnosis not present

## 2024-10-04 DIAGNOSIS — I82402 Acute embolism and thrombosis of unspecified deep veins of left lower extremity: Secondary | ICD-10-CM | POA: Diagnosis not present

## 2024-10-04 DIAGNOSIS — I10 Essential (primary) hypertension: Secondary | ICD-10-CM | POA: Diagnosis not present

## 2024-10-04 DIAGNOSIS — I2699 Other pulmonary embolism without acute cor pulmonale: Secondary | ICD-10-CM | POA: Diagnosis not present

## 2024-10-11 ENCOUNTER — Encounter (INDEPENDENT_AMBULATORY_CARE_PROVIDER_SITE_OTHER): Payer: Self-pay | Admitting: Gastroenterology

## 2024-10-11 DIAGNOSIS — Z85828 Personal history of other malignant neoplasm of skin: Secondary | ICD-10-CM | POA: Diagnosis not present

## 2024-10-11 DIAGNOSIS — L821 Other seborrheic keratosis: Secondary | ICD-10-CM | POA: Diagnosis not present

## 2024-10-11 DIAGNOSIS — D224 Melanocytic nevi of scalp and neck: Secondary | ICD-10-CM | POA: Diagnosis not present

## 2024-10-11 DIAGNOSIS — L570 Actinic keratosis: Secondary | ICD-10-CM | POA: Diagnosis not present

## 2024-10-11 DIAGNOSIS — D485 Neoplasm of uncertain behavior of skin: Secondary | ICD-10-CM | POA: Diagnosis not present

## 2024-10-11 DIAGNOSIS — D225 Melanocytic nevi of trunk: Secondary | ICD-10-CM | POA: Diagnosis not present

## 2024-10-11 DIAGNOSIS — L820 Inflamed seborrheic keratosis: Secondary | ICD-10-CM | POA: Diagnosis not present

## 2024-10-11 DIAGNOSIS — C44311 Basal cell carcinoma of skin of nose: Secondary | ICD-10-CM | POA: Diagnosis not present

## 2024-10-30 ENCOUNTER — Encounter: Payer: Self-pay | Admitting: Radiology

## 2024-11-01 ENCOUNTER — Ambulatory Visit: Admitting: Internal Medicine

## 2024-11-01 DIAGNOSIS — Z23 Encounter for immunization: Secondary | ICD-10-CM | POA: Diagnosis not present

## 2024-11-02 ENCOUNTER — Telehealth: Payer: Self-pay | Admitting: Internal Medicine

## 2024-11-02 NOTE — Telephone Encounter (Signed)
 Patient is concerned she has a history of DVT and clots in her lung and yesterday she got flu and COVID shots and developed a high fever with shivering overnight.  Patient wants a call back to discuss next steps.

## 2024-11-02 NOTE — Telephone Encounter (Signed)
 Spoke to patient and verbalized providers response. Pt voiced understanding. Pt had no further questions or concerns at this time.

## 2024-11-15 ENCOUNTER — Telehealth: Payer: Self-pay | Admitting: Internal Medicine

## 2024-11-15 DIAGNOSIS — R634 Abnormal weight loss: Secondary | ICD-10-CM

## 2024-11-15 DIAGNOSIS — M7989 Other specified soft tissue disorders: Secondary | ICD-10-CM

## 2024-11-15 MED ORDER — FUROSEMIDE 40 MG PO TABS: 40.0000 mg | ORAL_TABLET | Freq: Every day | ORAL | 3 refills | Status: AC

## 2024-11-15 NOTE — Telephone Encounter (Signed)
 She says her left leg is slightly more swollen than her right leg. She denies SOB but says she is  fat and has cankles  and talks about putting on 50 lbs after her mother died four years ago. At this point she wants to take extra lasix  and see how she feels so she will wait on lab work. She is also very interested in starting a diet to shed her extra weight and she will research the mediterranean diet.  I have sent message to [facility] schedulers to make sooner appointment. Patient can go to either [facility] or [facility] site.

## 2024-11-15 NOTE — Telephone Encounter (Signed)
 Patient started on lasix  20 mg  daily the end of September  Now, her weight is 198 lbs, even with compression stockings her feet are still swollen. She has not had any change in diet/sodium intake.   Please advise

## 2024-11-15 NOTE — Telephone Encounter (Signed)
 Pt c/o swelling/edema: STAT if pt has developed SOB within 24 hours  If swelling, where is the swelling located? Feet   How much weight have you gained and in what time span? None   Have you gained 2 pounds in a day or 5 pounds in a week? N/a  Do you have a log of your daily weights (if so, list)? No   Are you currently taking a fluid pill? Yes   Are you currently SOB? Nothing currently but states she has had it   Have you traveled recently in a car or plane for an extended period of time? No

## 2024-11-16 DIAGNOSIS — Z85828 Personal history of other malignant neoplasm of skin: Secondary | ICD-10-CM | POA: Diagnosis not present

## 2024-11-16 DIAGNOSIS — C44311 Basal cell carcinoma of skin of nose: Secondary | ICD-10-CM | POA: Diagnosis not present

## 2024-11-20 NOTE — Addendum Note (Signed)
 Addended by: KENETH ROSINA BROCKS on: 11/20/2024 08:55 AM   Modules accepted: Orders

## 2024-11-20 NOTE — Telephone Encounter (Signed)
 Patient notified and verbalized understanding. Patient had no questions or concerns at this time.

## 2024-11-20 NOTE — Addendum Note (Signed)
 Addended by: KENETH KNEE C on: 11/20/2024 09:03 AM   Modules accepted: Orders

## 2024-11-21 ENCOUNTER — Other Ambulatory Visit: Payer: Self-pay

## 2024-11-21 DIAGNOSIS — R634 Abnormal weight loss: Secondary | ICD-10-CM

## 2024-11-28 NOTE — Progress Notes (Unsigned)
 Cardiology Office Note:  .   Date:  11/29/2024  ID:  Judy Mitchell, DOB 1951/08/06, MRN [id number] PCP: Jeanette Comer Judy Mitchell  [facility] Providers Cardiologist:  Diannah SHAUNNA Maywood, MD {  History of Present Illness: .   Judy Mitchell is a 73 y.o. female  with PMHx of Moderate pulmonary HTN (ECHO 06/2023), acute DVT of LLE/bilateral PE in 08/2024, Moderate eccentric aortic regurgitation (ECHO 06/2024), HTN, HLD, hiatal hernia s/p Nissen fundoplication in 09/2018  who reports to [facility] office for follow up.   Last seen in heartcare 09/18/2024 by Dr. Mallipeddi for hospital follow-up after DVT/PE.  Reported chronic DOE that patient attributed to deconditioning since gaining weight after the loss of her mother.  Also reported minimal leg swelling in the left leg.  Decrease Toprol -XL from 50 mg to 25 mg daily.  Started Lasix  20 mg as needed.  Continued on amlodipine  2.5 mg daily, valsartan  160 mg daily, Lipitor 20 mg daily, Eliquis  5 mg twice daily.  Patient called office to report LE edema that is worse in the left leg than the right leg despite compression stockings and Lasix  20 mg as needed. Dr. Mallipeddi increase Lasix  to 40 mg daily on 11/15/2024. Patient also noted 50 pounds weight gain after her mother died 4 years ago.  Referred to weight loss program.  Today, reports LE edema with left worse than right that is worse at the end of the day after being up on feet all day.  She wears compression socks throughout the day but when she takes off at end of day her ankles are still swollen. Since increasing Lasix  to 40 mg daily, she has not notice any improvement in LE edema. However since starting to sleep with legs elevated, her swelling is better in the morning.  Denies any pain, redness or warmth in legs.  Also reports chronic unchanged DOE that has not worsened, however she has noticed it takes longer to return to normal breathing after resting. Denies chest pain, palpitations,  syncope, presyncope, dizziness, orthopnea, PND, or claudication.  Reports compliance with medications. She eats a lot of processed foods. She eats of a lot of carbs. She thinks she overeats foods. She is not very active and more sedentary. She suspect diet and sedentary lifestyle is contributing to weight gain. She reports that that she previously tried Phentermine and Wegovy, which was unsuccessful. She is not interested in any weight loss medications.   ROS: 10 point review of system has been reviewed and considered negative except ones been listed in the HPI.   Studies Reviewed: [facility]   Exercise Myoview  2019 The left ventricular ejection fraction is normal (55-65%). Nuclear stress EF: 55%. Blood pressure demonstrated a normal response to exercise. There was no ST segment deviation noted during stress. The study is normal. This is a low risk study.   Normal stress nuclear study with no ischemia or infarction.  Gated ejection fraction 55% with normal wall motion.  ECHO IMPRESSIONS 06/2024  1. Left ventricular ejection fraction, by estimation, is 60 to 65%. Left ventricular ejection fraction by 3D volume is 61 %. The left ventricle has normal function. The left ventricle has no regional wall motion abnormalities. Left ventricular diastolic parameters are consistent with Grade I diastolic dysfunction (impaired  relaxation).   2. Right ventricular systolic function is normal. The right ventricular size is normal.   3. The mitral valve is normal in structure. No evidence of mitral valve regurgitation. No evidence of mitral  stenosis.   4. The aortic valve cusps are not well visualized. There is moderate eccentric aortic valve regurgitation. PHT .   5. The inferior vena cava is normal in size with greater than 50% respiratory variability, suggesting right atrial pressure of 3 mmHg.     Physical Exam:   VS:  BP 136/70 (BP Location: Right Arm, Cuff Size: Large)   Pulse 68   Ht 5' 6 (1.676 m)    Wt 203 lb (92.1 kg)   SpO2 97%   BMI 32.77 kg/m    Wt Readings from Last 3 Encounters:  11/29/24 203 lb (92.1 kg)  09/18/24 194 lb 12.8 oz (88.4 kg)  08/08/24 192 lb 9.6 oz (87.4 kg)    GEN: Well nourished, well developed in no acute distress while sitting in chair.  NECK: No JVD; No carotid bruits CARDIAC: RRR, no murmurs, rubs, gallops RESPIRATORY:  Clear to auscultation without rales, wheezing or rhonchi  ABDOMEN: Soft, non-tender, non-distended EXTREMITIES:  No edema; No deformity   ASSESSMENT AND PLAN: .   Lower extremity edema Dyspnea on exertion Echo 06/2024 showed Echo 60 to 65%, G1 DD.  Echo already scheduled in 02/2024.  Can reassess if need to reschedule sooner based on pending labs and CXR.   Reports worsening LE edema with L > R that is worse at the end of the day. No improvement with increasing Lasix  to 40 mg, however since leg elevation at night she noticed some improvement. On exam, only trace LE edema with no pitting edema, redness, warmth, or painful to touch.  Order CXR, proBNP, BMP.  If signs of volume overload then increase diuretics and consider scheduling ECHO earlier or V/Q scan.  If no signs of volume overload then suspect secondary to DVT.  Continue Lasix  40 mg daily Encouraged to continue wearing compression stocks during the day and leg elevation at night.  History of DVT (deep vein thrombosis) History of pulmonary embolus (PE) Hospitalization at [facility] in 08/2024, diagnosed with acute DVT in left lower extremity and acute bilateral pulmonary embolism with no evidence of right heart strain on CT. [facility] team evaluated her and deemed this to be unprovoked pulmonary embolism and recommended 56-month systemic anticoagulant therapy. Suspect likely provoked due to sedentary lifestyle. Continue Eliquis  5 mg twice daily   Nonrheumatic aortic valve insufficiency Echo 06/2024 showed moderate concentric AV regurgitation Echo already scheduled in  02/2024  Moderate pulmonary hypertension (HCC) Noted on echo in 06/2023.  Echo 06/2024: No pulmonary hypertension.  Echo already scheduled in 02/2024 Per VM, If pulmonary HTN present on repeat echo then consider VQ scan to rule out CTEPH  Primary hypertension BP this OV well controlled today: 136/70 Continue on amlodipine  2.5 mg daily, valsartan  160 mg daily, Toprol  XL 25 mg daily, Lasix  as above Encourage physical activity for 150 minutes per week and heart healthy low sodium diet. Discussed limiting sodium intake to < 2 grams daily.    Hyperlipidemia LDL goal <100 LDL 103 in 12/2021.  Order FLP after first labs result.  Continue Lipitor 20 mg daily    Dispo: Follow up with ECHO and Dr. Mallipeddi as scheduled.   Signed, Lorette CINDERELLA Kapur, PA-C

## 2024-11-29 ENCOUNTER — Ambulatory Visit: Admitting: Physician Assistant

## 2024-11-29 ENCOUNTER — Other Ambulatory Visit (HOSPITAL_COMMUNITY)
Admission: RE | Admit: 2024-11-29 | Discharge: 2024-11-29 | Disposition: A | Source: Ambulatory Visit | Attending: Physician Assistant | Admitting: Physician Assistant

## 2024-11-29 ENCOUNTER — Encounter: Payer: Self-pay | Admitting: Physician Assistant

## 2024-11-29 ENCOUNTER — Ambulatory Visit: Payer: Self-pay | Admitting: Physician Assistant

## 2024-11-29 ENCOUNTER — Ambulatory Visit (HOSPITAL_COMMUNITY)
Admission: RE | Admit: 2024-11-29 | Discharge: 2024-11-29 | Disposition: A | Source: Ambulatory Visit | Attending: Physician Assistant

## 2024-11-29 VITALS — BP 136/70 | HR 68 | Ht 66.0 in | Wt 203.0 lb

## 2024-11-29 DIAGNOSIS — I351 Nonrheumatic aortic (valve) insufficiency: Secondary | ICD-10-CM | POA: Diagnosis not present

## 2024-11-29 DIAGNOSIS — Z86711 Personal history of pulmonary embolism: Secondary | ICD-10-CM | POA: Insufficient documentation

## 2024-11-29 DIAGNOSIS — R600 Localized edema: Secondary | ICD-10-CM

## 2024-11-29 DIAGNOSIS — I272 Pulmonary hypertension, unspecified: Secondary | ICD-10-CM | POA: Diagnosis not present

## 2024-11-29 DIAGNOSIS — Z86718 Personal history of other venous thrombosis and embolism: Secondary | ICD-10-CM | POA: Diagnosis not present

## 2024-11-29 DIAGNOSIS — R0609 Other forms of dyspnea: Secondary | ICD-10-CM

## 2024-11-29 DIAGNOSIS — E785 Hyperlipidemia, unspecified: Secondary | ICD-10-CM

## 2024-11-29 DIAGNOSIS — I10 Essential (primary) hypertension: Secondary | ICD-10-CM

## 2024-11-29 DIAGNOSIS — R9389 Abnormal findings on diagnostic imaging of other specified body structures: Secondary | ICD-10-CM | POA: Diagnosis not present

## 2024-11-29 DIAGNOSIS — R0602 Shortness of breath: Secondary | ICD-10-CM | POA: Diagnosis not present

## 2024-11-29 LAB — BASIC METABOLIC PANEL WITH GFR
Anion gap: 11 (ref 5–15)
BUN: 12 mg/dL (ref 8–23)
CO2: 24 mmol/L (ref 22–32)
Calcium: 9 mg/dL (ref 8.9–10.3)
Chloride: 104 mmol/L (ref 98–111)
Creatinine, Ser: 0.61 mg/dL (ref 0.44–1.00)
GFR, Estimated: >60 mL/min (ref >60)
Glucose, Bld: 91 mg/dL (ref 70–99)
Potassium: 4.5 mmol/L (ref 3.5–5.1)
Sodium: 139 mmol/L (ref 135–145)

## 2024-11-29 LAB — PRO BRAIN NATRIURETIC PEPTIDE: Pro Brain Natriuretic Peptide: 250 pg/mL (ref <300.0)

## 2024-11-29 NOTE — Patient Instructions (Signed)
 Medication Instructions:  Your physician recommends that you continue on your current medications as directed. Please refer to the Current Medication list given to you today.   Labwork: BNP,BMET today  Testing/Procedures: Chest x-ray today  Follow-Up: Keep follow up as scheduled with Dr.Mallipeddi  Any Other Special Instructions Will Be Listed Below (If Applicable).  If you need a refill on your cardiac medications before your next appointment, please call your pharmacy.

## 2024-12-01 NOTE — Telephone Encounter (Signed)
 Reports she received a mychart message with a request to start ozempic that a link attached to it. Advised that I am unaware of any messages that was sent through mychart regarding ozempic. Advised to not click the link and report this to [facility] IT Department through [place]. Reports she is not interested in ozempic and will submit a ticket to the IT department. Verbalized understanding.

## 2024-12-01 NOTE — Telephone Encounter (Signed)
 Patient called to follow-up on a message she received regarding Ozempic medication.

## 2024-12-01 NOTE — Telephone Encounter (Signed)
-----   Message from Herma KATHEE Blush sent at 12/01/2024  2:34 PM EST -----

## 2024-12-27 ENCOUNTER — Other Ambulatory Visit: Payer: Self-pay | Admitting: Cardiovascular Disease

## 2025-01-12 ENCOUNTER — Other Ambulatory Visit (HOSPITAL_BASED_OUTPATIENT_CLINIC_OR_DEPARTMENT_OTHER): Payer: Self-pay

## 2025-01-24 ENCOUNTER — Institutional Professional Consult (permissible substitution) (INDEPENDENT_AMBULATORY_CARE_PROVIDER_SITE_OTHER): Admitting: Nurse Practitioner

## 2025-01-24 ENCOUNTER — Telehealth: Payer: Self-pay | Admitting: Internal Medicine

## 2025-01-24 NOTE — Telephone Encounter (Signed)
 Pt c/o Shortness Of Breath: STAT if SOB developed within the last 24 hours or pt is noticeably SOB on the phone  1. Are you currently SOB (can you hear that pt is SOB on the phone)? No  2. How long have you been experiencing SOB? A few months  3. Are you SOB when sitting or when up moving around? Both  4. Are you currently experiencing any other symptoms? No

## 2025-01-24 NOTE — Telephone Encounter (Signed)
 Spoke with patient regarding her SOB. She stated that it has been consistent since her appointment in December with [facility]. She stated that she has been taking her Furosemide  as prescribed and wearing her compression socks, but doesn't feel that it is doing any good. She stated that she will be panting just walking from the kitchen to the living room or just to get up and open and close the blinds. Also, reported that her chest feels heavy at times no pain at all. She stated that she doesn't check her BP or HR. I advised her that will send over to Dr. Mallipeddi for review. In the meantime if symptoms worsen or continue to be consistent will need to go to ED to be evaluated preferably [facility] Patient verbalized understanding

## 2025-01-25 ENCOUNTER — Telehealth: Payer: Self-pay | Admitting: Internal Medicine

## 2025-01-25 NOTE — Addendum Note (Signed)
 Addended by: JOHNNYE LITTIE HERO on: 01/25/2025 04:26 PM   Modules accepted: Orders

## 2025-01-25 NOTE — Telephone Encounter (Signed)
 Per Dr. Mallipeddi: D/c metoprolol . I can see her tomorrow or next week depending on how her symptoms are. Echo needs to be rescheduled to STAT (mainly to evaluate AI). Her AI might be worsening. She needs LHC and RHC after Echo. Is she still taking Eliquis ? Per chart review, she has left leg swelling. Does she still have it? If yes, strongly recommend ER visit STAT to r/o DVT and acute PE (r/o Eliquis  failure if compliant). .  Called patient advised her that her Echo had been moved to January 30 @ 4:00 and be here at 3:45. Advised her that she will be called to have an appointment scheduled for follow up. Understands that if her symptoms worsen she will need to go to ED.

## 2025-01-25 NOTE — Telephone Encounter (Signed)
 Checking percert on the following patient for    ECHO- STAT -SCHEDULED FOR 01/26/2025

## 2025-01-26 ENCOUNTER — Ambulatory Visit: Attending: Internal Medicine

## 2025-01-26 DIAGNOSIS — I351 Nonrheumatic aortic (valve) insufficiency: Secondary | ICD-10-CM | POA: Diagnosis present

## 2025-01-30 LAB — ECHOCARDIOGRAM COMPLETE
AR max vel: 3.44 cm2
AV Area VTI: 3.6 cm2
AV Area mean vel: 3.43 cm2
AV Mean grad: 3.4 mmHg
AV Peak grad: 6 mmHg
AV Vena cont: 0.4 cm
Ao pk vel: 1.22 m/s
Area-P 1/2: 4.41 cm2
Calc EF: 57.9 %
P 1/2 time: 328 ms
S' Lateral: 3.2 cm
Single Plane A2C EF: 53.1 %
Single Plane A4C EF: 59.3 %

## 2025-02-01 ENCOUNTER — Encounter: Payer: Self-pay | Admitting: Internal Medicine

## 2025-02-01 ENCOUNTER — Ambulatory Visit: Admitting: Internal Medicine

## 2025-02-01 VITALS — BP 144/82 | HR 85 | Ht 66.0 in | Wt 209.6 lb

## 2025-02-01 DIAGNOSIS — R635 Abnormal weight gain: Secondary | ICD-10-CM

## 2025-02-01 DIAGNOSIS — R600 Localized edema: Secondary | ICD-10-CM

## 2025-02-01 DIAGNOSIS — R0602 Shortness of breath: Secondary | ICD-10-CM

## 2025-02-01 NOTE — Patient Instructions (Signed)
 Medication Instructions:  Your physician recommends that you continue on your current medications as directed. Please refer to the Current Medication list given to you today.  *If you need a refill on your cardiac medications before your next appointment, please call your pharmacy*  Lab Work: TSH CMP If you have labs (blood work) drawn today and your tests are completely normal, you will receive your results only by: MyChart Message (if you have MyChart) OR A paper copy in the mail If you have any lab test that is abnormal or we need to change your treatment, we will call you to review the results.  Testing/Procedures: Your physician has recommended that you have a pulmonary function test. Pulmonary Function Tests are a group of tests that measure how well air moves in and out of your lungs.  VQ Scan   Follow-Up: At [facility], you and your health needs are our priority.  As part of our continuing mission to provide you with exceptional heart care, our providers are all part of one team.  This team includes your primary Cardiologist (physician) and Advanced Practice Providers or APPs (Physician Assistants and Nurse Practitioners) who all work together to provide you with the care you need, when you need it.  Your next appointment:   4 month(s)  Provider:   You may see Vishnu P Mallipeddi, MD or one of the following Advanced Practice Providers on your designated Care Team:   Brittany Strader, PA-C  Scotesia Bourbon, [place] Olivia Pavy, [place]     We recommend signing up for the patient portal called MyChart.  Sign up information is provided on this After Visit Summary.  MyChart is used to connect with patients for Virtual Visits (Telemedicine).  Patients are able to view lab/test results, encounter notes, upcoming appointments, etc.  Non-urgent messages can be sent to your provider as well.   To learn more about what you can do with MyChart, go to [email or web address].    Other Instructions Thank you for choosing [facility]!

## 2025-02-02 ENCOUNTER — Ambulatory Visit: Payer: Self-pay | Admitting: Internal Medicine

## 2025-02-02 ENCOUNTER — Encounter (HOSPITAL_COMMUNITY)

## 2025-02-02 ENCOUNTER — Other Ambulatory Visit (HOSPITAL_COMMUNITY): Admission: RE | Admit: 2025-02-02 | Source: Ambulatory Visit

## 2025-02-02 ENCOUNTER — Ambulatory Visit (HOSPITAL_COMMUNITY): Admission: RE | Admit: 2025-02-02 | Source: Ambulatory Visit

## 2025-02-02 DIAGNOSIS — R600 Localized edema: Secondary | ICD-10-CM

## 2025-02-02 DIAGNOSIS — R635 Abnormal weight gain: Secondary | ICD-10-CM

## 2025-02-02 DIAGNOSIS — R0602 Shortness of breath: Secondary | ICD-10-CM

## 2025-02-02 LAB — COMPREHENSIVE METABOLIC PANEL WITH GFR
ALT: 9 U/L (ref 0–44)
AST: 19 U/L (ref 15–41)
Albumin: 4.1 g/dL (ref 3.5–5.0)
Alkaline Phosphatase: 102 U/L (ref 38–126)
Anion gap: 16 — ABNORMAL HIGH (ref 5–15)
BUN: 12 mg/dL (ref 8–23)
CO2: 20 mmol/L — ABNORMAL LOW (ref 22–32)
Calcium: 9 mg/dL (ref 8.9–10.3)
Chloride: 103 mmol/L (ref 98–111)
Creatinine, Ser: 0.65 mg/dL (ref 0.44–1.00)
GFR, Estimated: >60 mL/min
Glucose, Bld: 95 mg/dL (ref 70–99)
Potassium: 4.3 mmol/L (ref 3.5–5.1)
Sodium: 138 mmol/L (ref 135–145)
Total Bilirubin: 0.4 mg/dL (ref 0.0–1.2)
Total Protein: 6.5 g/dL (ref 6.5–8.1)

## 2025-02-02 LAB — TSH: TSH: 0.827 u[IU]/mL (ref 0.350–4.500)

## 2025-03-12 ENCOUNTER — Other Ambulatory Visit

## 2025-03-22 ENCOUNTER — Encounter (HOSPITAL_COMMUNITY)

## 2025-03-22 ENCOUNTER — Ambulatory Visit: Admitting: Internal Medicine

## 2025-06-01 ENCOUNTER — Ambulatory Visit: Admitting: Internal Medicine
